# Patient Record
Sex: Male | Born: 1950 | Race: White | Hispanic: Yes | Marital: Married | Smoking: Never smoker
Health system: Southern US, Community
[De-identification: ages and names within clinical notes are randomized; demographics above are authoritative.]

## PROBLEM LIST (undated history)

## (undated) DIAGNOSIS — R112 Nausea with vomiting, unspecified: Secondary | ICD-10-CM

## (undated) DIAGNOSIS — I499 Cardiac arrhythmia, unspecified: Secondary | ICD-10-CM

## (undated) DIAGNOSIS — Z9889 Other specified postprocedural states: Secondary | ICD-10-CM

## (undated) DIAGNOSIS — M1990 Unspecified osteoarthritis, unspecified site: Secondary | ICD-10-CM

## (undated) DIAGNOSIS — K219 Gastro-esophageal reflux disease without esophagitis: Secondary | ICD-10-CM

## (undated) DIAGNOSIS — I10 Essential (primary) hypertension: Secondary | ICD-10-CM

## (undated) DIAGNOSIS — G4730 Sleep apnea, unspecified: Secondary | ICD-10-CM

## (undated) DIAGNOSIS — E041 Nontoxic single thyroid nodule: Secondary | ICD-10-CM

## (undated) HISTORY — PX: OTHER SURGICAL HISTORY: SHX169

---

## 2005-02-27 ENCOUNTER — Ambulatory Visit: Payer: Self-pay | Admitting: Gastroenterology

## 2011-03-25 DIAGNOSIS — G4730 Sleep apnea, unspecified: Secondary | ICD-10-CM

## 2011-03-25 HISTORY — DX: Sleep apnea, unspecified: G47.30

## 2011-07-11 ENCOUNTER — Ambulatory Visit: Payer: Self-pay | Admitting: Unknown Physician Specialty

## 2011-08-04 ENCOUNTER — Ambulatory Visit: Payer: Self-pay | Admitting: Unknown Physician Specialty

## 2011-09-03 ENCOUNTER — Ambulatory Visit: Payer: Self-pay | Admitting: Cardiology

## 2011-10-02 ENCOUNTER — Ambulatory Visit: Payer: Self-pay | Admitting: Cardiology

## 2012-12-15 ENCOUNTER — Observation Stay: Payer: Self-pay | Admitting: Internal Medicine

## 2012-12-15 LAB — BASIC METABOLIC PANEL
BUN: 21 mg/dL — ABNORMAL HIGH (ref 7–18)
Chloride: 105 mmol/L (ref 98–107)
Co2: 27 mmol/L (ref 21–32)
Creatinine: 1.1 mg/dL (ref 0.60–1.30)
EGFR (African American): >60
EGFR (Non-African Amer.): >60
Osmolality: 280 (ref 275–301)
Potassium: 3.4 mmol/L — ABNORMAL LOW (ref 3.5–5.1)
Sodium: 138 mmol/L (ref 136–145)

## 2012-12-15 LAB — CBC
HCT: 45.1 % (ref 40.0–52.0)
MCH: 30.7 pg (ref 26.0–34.0)
MCHC: 34.6 g/dL (ref 32.0–36.0)
RBC: 5.09 10*6/uL (ref 4.40–5.90)
WBC: 8.4 10*3/uL (ref 3.8–10.6)

## 2012-12-15 LAB — TROPONIN I
Troponin-I: <0.02 ng/mL
Troponin-I: <0.02 ng/mL
Troponin-I: <0.02 ng/mL

## 2012-12-15 LAB — CK TOTAL AND CKMB (NOT AT ARMC)
CK, Total: 86 U/L (ref 35–232)
CK, Total: 82 U/L (ref 35–232)

## 2013-01-03 ENCOUNTER — Ambulatory Visit: Payer: Self-pay | Admitting: Family Medicine

## 2013-07-28 DIAGNOSIS — G4733 Obstructive sleep apnea (adult) (pediatric): Secondary | ICD-10-CM | POA: Insufficient documentation

## 2013-07-28 DIAGNOSIS — K219 Gastro-esophageal reflux disease without esophagitis: Secondary | ICD-10-CM | POA: Insufficient documentation

## 2013-09-09 DIAGNOSIS — E041 Nontoxic single thyroid nodule: Secondary | ICD-10-CM | POA: Insufficient documentation

## 2013-09-09 DIAGNOSIS — E049 Nontoxic goiter, unspecified: Secondary | ICD-10-CM | POA: Insufficient documentation

## 2014-07-14 NOTE — Consult Note (Signed)
PATIENT NAME:  Frank Stephens, Frank Stephens DATE OF BIRTH:  May 07, 1950  DATE OF CONSULTATION:  12/15/2012  CONSULTING PHYSICIAN:  Dwayne Stephens. Juliann Paresallwood, MD  PRIMARY CARE PHYSICIAN:  Dr. Maryjane HurterFeldpausch.   REFERRING PHYSICIAN:  Dr. Amado CoeGouru.  INDICATION: Chest pain, hypoxemia.   HISTORY OF PRESENT ILLNESS: The patient is a 64 year old white male with a history of obstructive sleep apnea and hypertension who presented to the Emergency Room with chest pain and tightness. The patient reported the night prior to admission he was lying in bed wearing his CPAP and suddenly had heaviness in his chest, with shortness of breath. He could not breathe.  He felt anxious.  He does not have a history of chronic anxiety. When he could not breath, he started feeling anxious and came to the Emergency Room. Troponins were found to be negative x 2, EKGs were nonspecific. CT of the chest was negative. Pain was better on 2 liters of O2, but he was found to be relatively hypoxic on room air, so he was advised to be admitted for further evaluation and care. Reportedly Dr. Lady GaryFath did a regular stress test on him about a year ago as part of preop evaluation.   REVIEW OF SYSTEMS:  He has had no blackout spells, syncope. No nausea or vomiting. No fever. No chills. No sweats. No weight loss. No weight gain. No hemoptysis or hematemesis. No bright red blood per rectum. He has had shortness of breath, dyspnea, congestion and chest pain.   PAST MEDICAL HISTORY:  Obstructive sleep apnea, hypertension, allergic rhinitis   PAST SURGICAL HISTORY: Shoulder surgery and bilateral knee surgery.   ALLERGIES: None.   SOCIAL HISTORY: Lives with his wife and kids.  No smoking. Denies significant alcohol consumption. Works as a Paramedicpostal worker.   FAMILY HISTORY: DJD, hip surgery, hypertension, breast cancer, dementia.     PHYSICAL EXAMINATION: VITAL SIGNS: Blood pressure 130/80, pulse 80, respiratory rate 16, afebrile.  HEENT:  Normocephalic, atraumatic. Pupils equal and reactive to light.  NECK: Supple. No significant JVD, bruits, or adenopathy.  LUNGS: Exam was clear to auscultation and percussion with bilateral rhonchi. No wheezing or rales.  HEART: Regular rate and rhythm.  ABDOMEN:  Positive bowel sounds.  No rebound, guarding, or tenderness.  EXTREMITIES: Within normal limits. No cyanosis, clubbing, or edema.  NEUROLOGIC: Intact.  SKIN: Normal.   LABS/STUDIES: CBC was normal. ABG 21% room air, bicarbonate  Troponin 0.02. Glucose 119, BUN 21, creatinine 1.12, sodium 138, potassium 3.4, chloride 105, CO2 27. EKG normal sinus rhythm, nonspecific ST-T changes.   ASSESSMENT: 1.  Chest pain.  2.  Hypoxemia.  3.  Obstructive sleep apnea. 4.  Hypertension. 5.  Smoking.   PLAN:   1.  Agree with admit. Rule out for myocardial infarction. Follow up cardiac enzymes. Follow-up EKG. Follow-up telemetry.  2.  For hypoxemia continue supplemental oxygen. We will use inhalers, bronchodilators,  consider steroid therapy and oxygen. Continue CPAP in the meantime. Consider whether pulmonary follow-up is necessary.  3.  Advised the patient to quit smoking as part of pulmonary secondary prevention.  4.  Hypertension. Continue blood pressure therapy for now. Hold hydrochlorothiazide for now. Beta blocker may be helpful or calcium blocker to help with chest pain symptoms and blood pressure control.  5.  For obstructive sleep apnea place on CPAP.  6.  At this point and I do not believe further cardiac work-up is necessary. He had a regular stress test as an outpatient. If he has  improvement, we will try to consider further work-up as an outpatient with Dr. Lady Gary at a later date."   ____________________________ Bobbie Stack. Juliann Pares, MD ddc:dp Stephens: 12/15/2012 14:13:00 ET T: 12/15/2012 15:13:23 ET JOB#: [id number]  cc: Dwayne Stephens. Juliann Pares, MD, <Dictator> Alwyn Pea MD ELECTRONICALLY SIGNED 01/13/2013 13:51

## 2014-07-14 NOTE — Discharge Summary (Signed)
PATIENT NAME:  Frank Stephens, Frank Stephens MR#:  [id number] DATE OF BIRTH:  27-Mar-1950  DATE OF ADMISSION:  12/15/2012 DATE OF DISCHARGE:  12/15/2012  DISCHARGE DIAGNOSES: 1. Chest pain secondary to anxiety.  2. Hypertension.  3. Anxiety.  4. Chronic obstructive pulmonary disease.   DISCHARGE MEDICATIONS:  1. Aspirin 81 mg daily.  2. Hydrochlorothiazide and valsartan 25/160 mg 1 tablet p.o. daily.  3. Osteo-Bi-Flex 250/200 mg 2 tablets daily.  4. Ibuprofen 200 mg 2 tablets every 6 to 8 hours as needed.  5. Flonase 50 mcg 1 spray in each nostril daily.  6. Combivent Respimat 1 puff 4 times daily.  7. Prednisone 20 mg 2 tablets daily for 3 days, 1 tablet daily for 3 days and then stop. 8. Nitroglycerin sublingual p.r.n. for chest pain.  9. Prilosec 20 mg p.o. daily.  10. Oxygen 2 liters via nasal cannula all the time.   DIET: Low sodium diet.   CONSULTATIONS: Cardiology consult, Dr. Juliann Paresallwood.   HOSPITAL COURSE: A 64 year old male patient admitted for chest pain. Troponins have been negative x3. The patient was admitted to telemetry. The patient had a history of stress test in April this year with Dr. Lady GaryFath which was normal. The patient has a CPAP at night and because of chest heaviness the patient was placed on observation. The patient also was hypoxic with O2 sats low on room air and the patient's documented O2 sats 87% on room air, the patient on 2 liters 97% saturation. The patient seen by Dr. Juliann Paresallwood and he recommended that as his stress test was normal in April, he recommended to discharge the patient home and follow up with Dr. Lady GaryFath. Regarding his hypoxia, he did have some bronchitis. The patient had a CT of the chest, which showed no PE, has coronary artery disease and right thyroid lobe nodule, which was 8 mm, and the patientTSH   test was normal. His white count and BMP were normal. Chest x-ray also did not show any infiltrates. The patient uses oxygen 2 liters via nasal cannula all the time,  so discharged home with oxygen, gave  prescriptions for Combivent and Z-Pak, and the patient needs to follow up with his primary doctor and also Dr. Lady GaryFath. The patient's primary doctor is Film/video editorDanica Glass.advised pt to follow up with pmd regarding thyroid nodule,likely need thyroid sono and endocrinology follow up as out pt,  DISCHARGE VITAL SIGNS: Temperature 98.4, O2 sats on room air 95, blood pressure 133/77.   CONDITION AT THE TIME OF DISCHARGE: Stable.   TIME SPENT ON DISCHARGE PREPARATION: More than 30 minutes.   ____________________________ Katha HammingSnehalatha Saleema Weppler, MD sk:sg Stephens: 12/16/2012 12:47:00 ET T: 12/16/2012 13:19:17 ET JOB#: [id number]  cc: Katha HammingSnehalatha Nickalous Stingley, MD, <Dictator> Katha HammingSNEHALATHA Jesiah Grismer MD ELECTRONICALLY SIGNED 01/04/2013 10:47

## 2014-07-14 NOTE — H&P (Signed)
PATIENT NAME:  Frank Stephens, Frank Stephens MR#:  [id number] DATE OF BIRTH:  January 21, 1951  DATE OF ADMISSION:  12/15/2012  PRIMARY CARE PHYSICIAN: Marina Goodell, MD  REFERRING PHYSICIAN: Rebecka Apley, MD  CHIEF COMPLAINT: Chest pain and hypoxia.   HISTORY OF PRESENT ILLNESS: The patient is a 64 year old Caucasian male with a past medical history of obstructive sleep apnea, hypertension, who is presenting to the ER with a chief complaint of chest pressure and tightness. The patient is reporting that last night at around 11:30 p.m. while he was in his bed with his CPAP, he suddenly started having heaviness in his chest associated with shortness of breath. He could not breathe and felt anxious. He denies any chronic history of anxiety, but when he could not breathe, he started feeling anxious and came into the ER. In the ER, his troponins were found to be negative x2. EKG: No acute ST-T wave changes. CT angiogram of the chest has revealed no pulmonary embolism, no aortic aneurysm either. The patient denies any significant history of smoking in the past. The patient was satting fine in mid 90s on 2 liters of oxygen, but when the ER physician tried to be wean him off the oxygen, his pulse oximetry is dropping down to mid 80s. The patient also reported that he was diagnosed with allergic rhinitis recently and started on Flonase. He is having postnasal drip. Denies any sick contacts. No cough. Denies any fever. No similar complaints in the past. The patient was seen by Dr. Lady Gary, cardiologist, in the past for preop clearance prior to the shoulder surgery and had a stress test done in April 2014 which was normal. Denies any other complaints. Denies nausea, vomiting, diarrhea.   PAST MEDICAL HISTORY:  1. Obstructive sleep apnea, uses CPAP at bedtime.  2. Hypertension.  3. Allergic rhinitis.   PAST SURGICAL HISTORY:  1. Shoulder surgery.  2. Bilateral knee surgeries.  ALLERGIES: No known allergies.    PSYCHOSOCIAL HISTORY: Lives at home with wife. Intermittent smoking, a few cigarettes, several years ago, and he quit completely with smoking 20 years ago. No history of alcohol or illicit drug usage. Works for [employer].  FAMILY HISTORY: Mom had history of total hip replacement, hypertension and breast cancer. Father has dementia and hypertension.   REVIEW OF SYSTEMS:  CONSTITUTIONAL: Denies fever, fatigue.  EYES: Denies blurry vision, glaucoma.  ENT: Denies epistaxis, discharge. RESPIRATION: Denies cough, COPD.  CARDIOVASCULAR: Complaining of chest pain and shortness of breath.  GASTROINTESTINAL: Denies nausea, vomiting, diarrhea.  GENITOURINARY: No dysuria, hematuria.  ENDOCRINE: Denies polyuria, nocturia or thyroid problems.  HEMATOLOGIC AND LYMPHATIC: Denies anemia, easy bruising or bleeding.  MUSCULOSKELETAL: No joint pain in the neck, back and shoulder.  NEUROLOGIC: Denies vertigo or ataxia.  PSYCHIATRIC: No ADD, OCD.   PHYSICAL EXAMINATION:  VITAL SIGNS: Temperature 97.7, pulse 82, respirations 18, blood pressure 131/83, pulse oximetry 94% on 2 liters.  GENERAL APPEARANCE: Not under acute distress. Moderately built and nourished.  HEENT: Normocephalic, atraumatic. Pupils are equally reacting to light and accommodation. No scleral icterus. No conjunctival injection. Extraocular movements are intact. No nasal discharge, but positive postnasal drip. Ears: No external lesions. No drainage. Mouth: No lesions. Moist mucous membranes. Positive postnasal drip on the left side.  NECK: Supple. No JVD. No thyromegaly. Range of motion is intact.  LUNGS: Clear to auscultation. No wheezing. Good respiratory effort. No crackles.  CARDIOVASCULAR: Regular rate and rhythm with sinus arrhythmia. No murmurs, gallops or clicks. Pulses  are equal bilaterally in the femoral area. No peripheral edema.  GASTROINTESTINAL: Soft. Bowel sounds are positive in all 4 quadrants. Nontender,  nondistended. No hepatosplenomegaly. No masses felt.  NEUROLOGIC: Awake, alert and oriented x3. Cranial nerves II through XII are grossly intact. Motor and sensory grossly intact. Reflexes are 2+. SKIN: Warm to touch. Normal turgor. No rashes. No lesions. Well hydrated.  MUSCULOSKELETAL: No joint effusion, tenderness or erythema. Range of motion is grossly intact.  PSYCHIATRIC: Adequate judgment and insight. Normal mood and affect.   LABORATORY DATA AND IMAGING STUDIES: CBC normal. ABG: pH 7.42, pCO2 39, pO2 is 63 on FiO2 21%, base excess 0.8, bicarbonate is 25.3. Troponin less than 0.02 x2. Glucose 119, BUN 21, creatinine 1.10, sodium 138, potassium 3.4, chloride 105, CO2 27, GFR greater than 60, serum osmolality 280, calcium 8.5. A 12-lead EKG: Sinus arrhythmia, sinus rhythm at 79 beats per minute, normal PR and QRS intervals. No acute ST-T wave changes. CT angiogram of the chest: No pulmonary embolism. No acute findings in the chest.   ASSESSMENT AND PLAN: A 64 year old Caucasian male presenting to the ER with a chief complaint of chest tightness and hypoxia. Will be admitted with the following assessment and plan.   1. Chest pain, rule out acute myocardial infarction. Will admit him to telemetry. Cycle cardiac biomarkers. Will obtain echocardiogram. ACS protocol with oxygen, nitroglycerin, aspirin, beta blocker and statin. Cardiology consult is placed to Dr. Lady GaryFath. CT of chest is negative for pulmonary embolism.  2. Hypoxia, is probably from postnasal drip, coughing, bronchial constriction. Will provide him nebulizer treatments, steroids and oxygen via nasal cannula.  3. Obstructive sleep apnea. Continue CPAP at bedtime.  4. Hypertension. Resume his home medications. Will hold off on the hydrochlorothiazide as I am adding beta blocker to his regimen in view of chest pain.  5. Provide gastrointestinal and deep vein thrombosis prophylaxis.   Diagnosis and plan of care were discussed in detail with  the patient. He is aware of the plan.   CODE STATUS: He is full code. Wife is medical power of attorney.  TOTAL TIME SPENT ON ADMISSION: 45 minutes.   ____________________________ Ramonita LabAruna Shadrach Bartunek, MD ag:OSi D: 12/15/2012 07:50:31 ET T: 12/15/2012 08:06:41 ET JOB#: [id number]  cc: Ramonita LabAruna Kapil Petropoulos, MD, <Dictator> Darlin PriestlyKenneth A. Lady GaryFath, MD Ramonita LabARUNA Lynett Brasil MD ELECTRONICALLY SIGNED 12/24/2012 7:10

## 2014-07-16 NOTE — Op Note (Signed)
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   PROCEDURE PERFORMED: Arthroscopic chondral debridement of left shoulder along with debridement of torn labrum and subacromial decompression.   SURGEON: Alda BertholdHarold B. Tyeshia Cornforth, Jr., MD  ANESTHESIA: General.   HISTORY: The patient had a long history of left shoulder pain. He had been refractory to injection of his left subacromial space and his glenohumeral space with steroid and anesthetic. His plain films revealed very minimal degenerative change. MRI was consistent with some glenohumeral arthritis along with a torn posterior horn of his labrum and tendinosis. The patient was ultimately brought in for surgery due to his persistent symptoms despite conservative treatment.   DESCRIPTION OF PROCEDURE: The patient was taken to the operating room where satisfactory general anesthesia was achieved. The patient was turned to the lateral decubitus position with the left shoulder up. The left shoulder was prepped and draped in the usual fashion for an arthroscopic procedure. We used the Acufex shoulder suspension device to maintain the shoulder in about 25 degrees of abduction and about 10 degrees of forward flexion.   The scope was introduced through a posterior portal into the glenohumeral joint. The joint was extended with lactated Ringer's. We used the Mitek fluid pump to facilitate joint distention.   Inspection of the glenohumeral joint revealed the patient had some grade III changes in his humeral head chondral surface and in the articular surface of the glenoid. The labrum itself was frayed both anteriorly  and posteriorly. The biceps tendon appeared to be intact. Several loose bodies were noted. No obvious rotator cuff tear was appreciated.   I went ahead and established an anterior portal from inside out using a Wissinger rod. A Turbo Whisker was inserted to debride the chondral lesions and then a synovial resector was introduced to debride the frayed anterior labrum and to debride some of the synovial thickening anteriorly. Several loose bodies were removed at this time through the anterior cannula.   I then switched the scope to the anterior portal and brought a synovial resector into the posterior portal and debrided the posterior labral tear. I did insert a [employer]agon ArthroCare wand through the posterior portal and used it to coblate the humeral head and glenoid chondral lesions. I also introduced the wand through the anterior portal to finalize the coblation portion of the procedure.   I introduced the scope into the subacromial space. There was some fraying of the undersurface of the acromion and fraying of the rotator cuff. No significant cuff tear was appreciated, however. I established a lateral portal and used this portal to debride the subacromial space with a synovial resector and then an angled ArthroCare wand was used to remove the soft tissue from the undersurface of the acromion. The scope was then switched to a lateral portal and I brought a large acromionizer bur in through the posterior portal and used it to perform a subacromial decompression. The acromial attachment of the coracoacromial ligament was released at this time.   I then reinspected the cuff. Other than some fraying no obvious tear was appreciated.    The cannulas were removed and then I closed the puncture wounds with 3-0 nylon in vertical mattress fashion. Several milliliters of  0.5%  Marcaine without epinephrine was injected about each puncture wound and about 5 mL was injected into the subacromial space.   Betadine was  applied to the wounds followed by a sterile dressing. Four TENS pads were placed about the shoulder and then a sling was applied. The patient was turned supine and awakened. He was transferred to a stretcher bed and taken to the recovery room in satisfactory condition. Blood loss was negligible. ____________________________ Alda Berthold., MD hbk:slb Stephens: 08/04/2011 10:54:21 ET     T: 08/04/2011 16:24:43 ET        JOB#: [id number] Alda Berthold MD ELECTRONICALLY SIGNED 08/13/2011 18:57

## 2015-03-26 IMAGING — CR DG CHEST 1V PORT
1 series · 2 of 2 positions shown · non-contrast
Comparison: none

REASON FOR EXAM: shortness of breath
COMMENTS:   LMP: (Male)

PROCEDURE:     DXR - DXR PORTABLE CHEST SINGLE VIEW  - December 15, 2012  [DATE]
RESULT:     The lungs are clear. The heart and pulmonary vessels are normal.
The bony and mediastinal structures are unremarkable. There is no effusion.
There is no pneumothorax or evidence of congestive failure.

[Series 1: ap · 0.17mm/px · 2 of 2 slices shown]
[im 1/2]
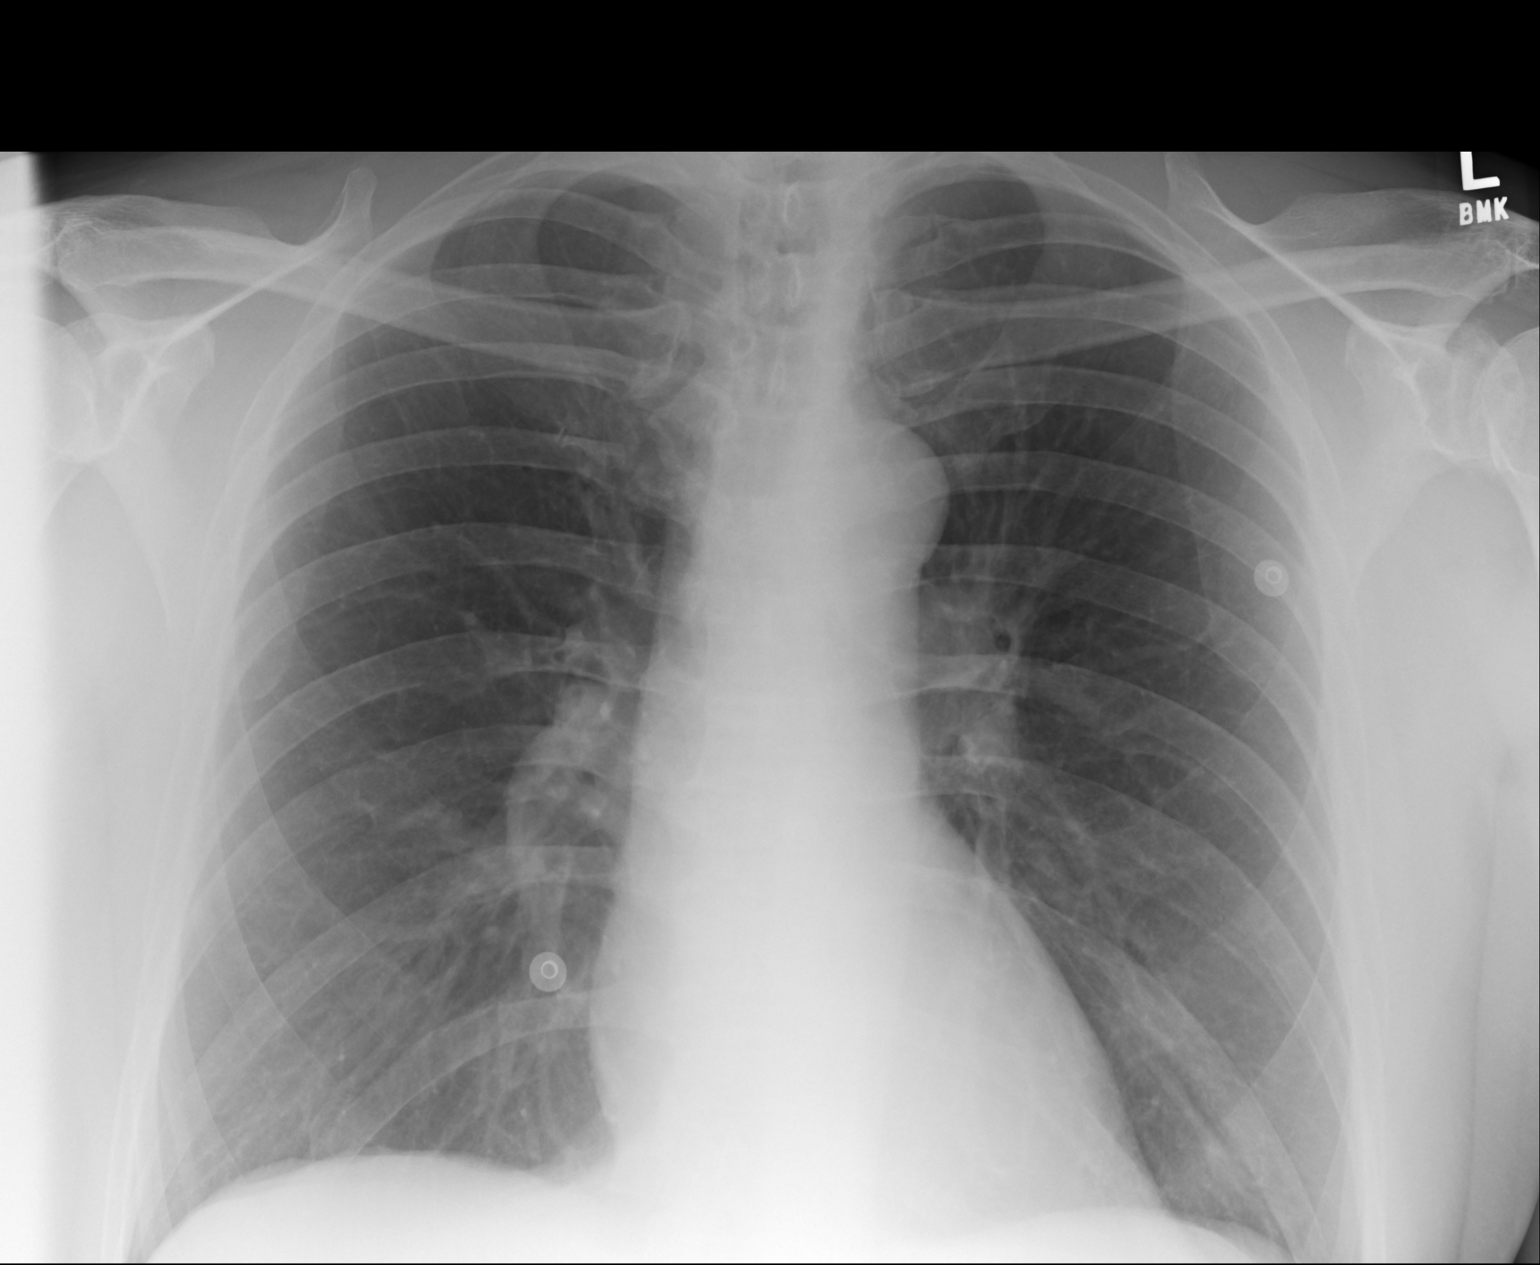
[im 2/2]
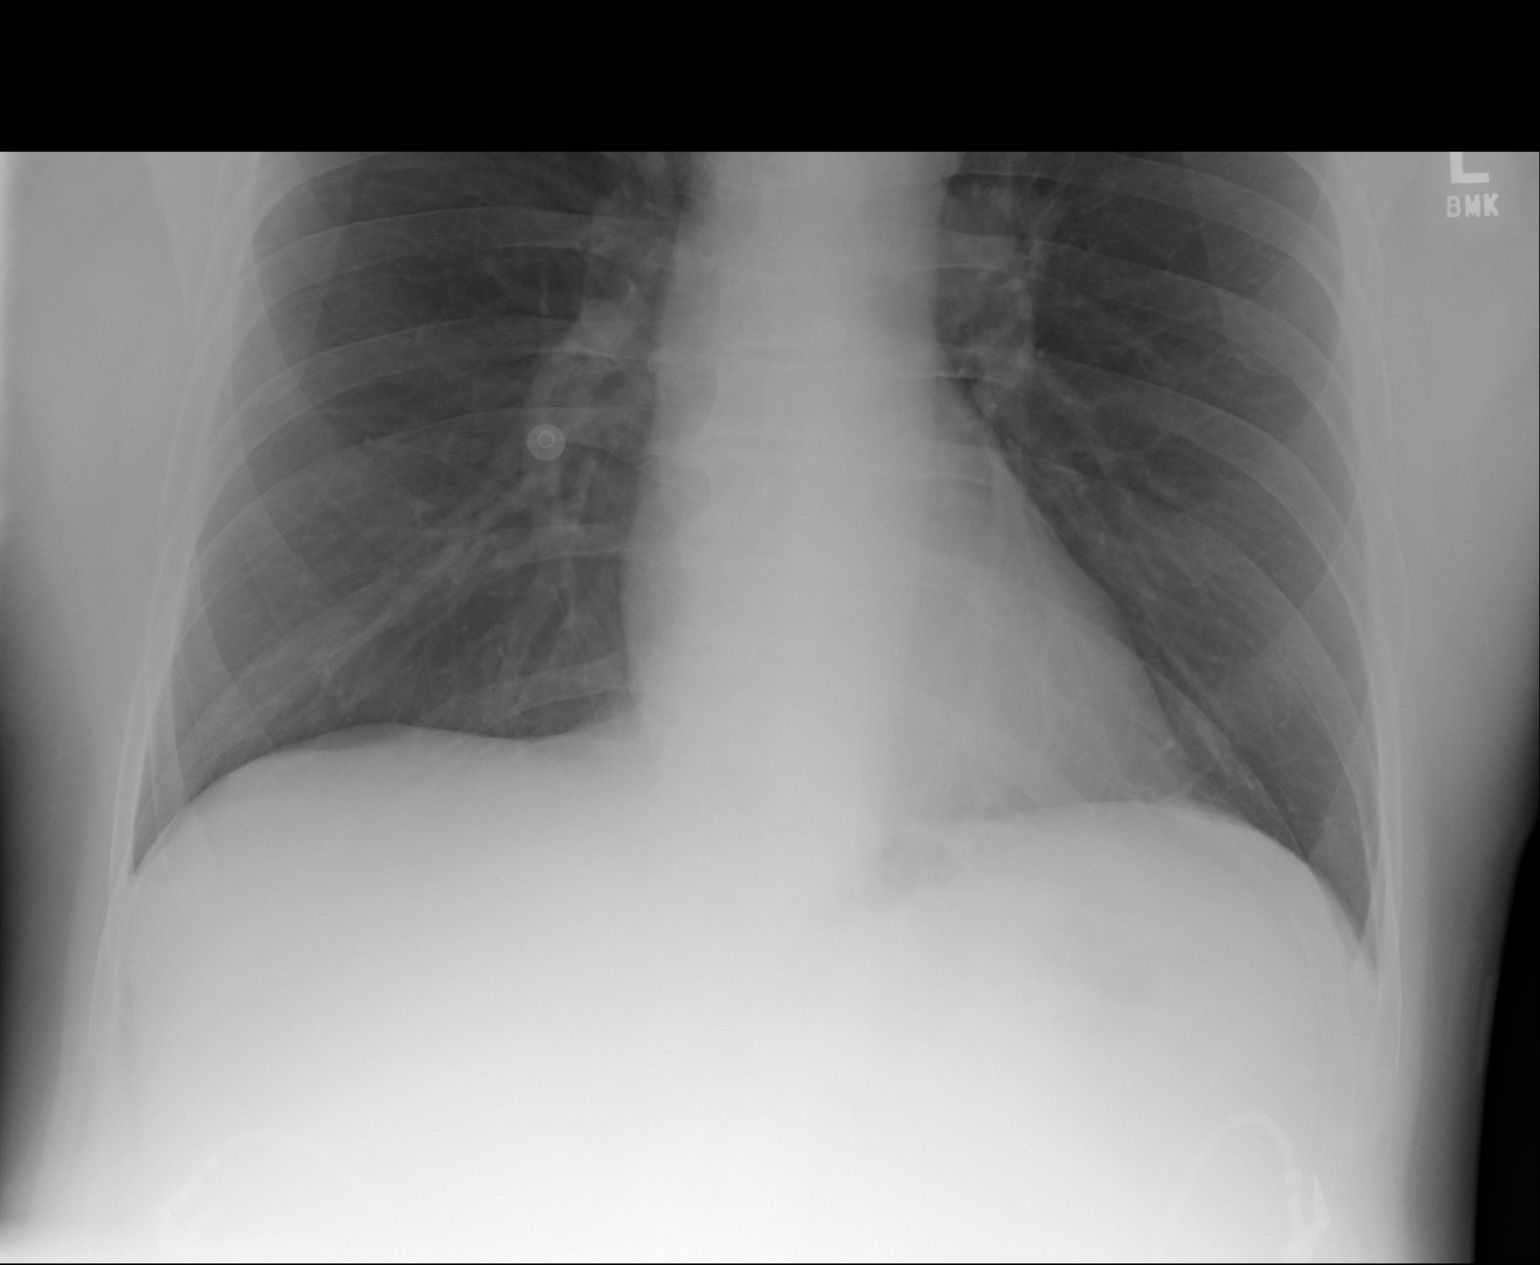

[2 of 2 positions shown; findings below may reference images not displayed]

IMPRESSION: No acute cardiopulmonary disease.

[REDACTED]

## 2015-05-31 ENCOUNTER — Ambulatory Visit
Admission: EM | Admit: 2015-05-31 | Discharge: 2015-05-31 | Disposition: A | Discharge status: Home / Self Care | Payer: 59 | Attending: Family Medicine | Admitting: Family Medicine

## 2015-05-31 DIAGNOSIS — L309 Dermatitis, unspecified: Secondary | ICD-10-CM

## 2015-05-31 DIAGNOSIS — L508 Other urticaria: Secondary | ICD-10-CM

## 2015-05-31 HISTORY — DX: Sleep apnea, unspecified: G47.30

## 2015-05-31 HISTORY — DX: Gastro-esophageal reflux disease without esophagitis: K21.9

## 2015-05-31 HISTORY — DX: Essential (primary) hypertension: I10

## 2015-05-31 MED ORDER — PREDNISONE 10 MG (21) PO TBPK: ORAL_TABLET | ORAL | Status: DC

## 2015-05-31 MED ORDER — RANITIDINE HCL 150 MG PO CAPS: 150.0000 mg | ORAL_CAPSULE | Freq: Two times a day (BID) | ORAL | Status: DC

## 2015-05-31 MED ORDER — LORATADINE 10 MG PO TABS: 10.0000 mg | ORAL_TABLET | Freq: Every day | ORAL | Status: DC

## 2015-05-31 NOTE — ED Notes (Signed)
Patient c/o itchy rash on stomach, lower back, both arms, and both legs which he noticed several months ago.

## 2015-05-31 NOTE — Discharge Instructions (Signed)
Rash A rash is a change in the color or feel of your skin. There are many different types of rashes. You may have other problems along with your rash. HOME CARE  Avoid the thing that caused your rash.  Do not scratch your rash.  You may take cools baths to help stop itching.  Only take medicines as told by your doctor.  Keep all doctor visits as told. GET HELP RIGHT AWAY IF:   Your pain, puffiness (swelling), or redness gets worse.  You have a fever.  You have new or severe problems.  You have body aches, watery poop (diarrhea), or you throw up (vomit).  Your rash is not better after 3 days. MAKE SURE YOU:   Understand these instructions.  Will watch your condition.  Will get help right away if you are not doing well or get worse.   This information is not intended to replace advice given to you by your health care provider. Make sure you discuss any questions you have with your health care provider.   Document Released: 08/27/2007 Document Revised: 06/02/2011 Document Reviewed: 07/26/2014 Elsevier Interactive Patient Education 2016 [employer]  Stasis Dermatitis Stasis dermatitis occurs when veins lose the ability to pump blood back to the heart (poor venous circulation). It causes a reddish-purple to brownish scaly, itchy rash on the legs. The rash comes from pooling of blood (stasis). CAUSES  This occurs because the veins do not work very well anymore or because pressure may be increased in the veins due to other conditions. With blood pooling, the increased pressure in the tiny blood vessels (capillaries) causes fluid to leak out of the capillaries into the tissue. The extra fluid makes it harder for the blood to feed the cells and get rid of waste products. SYMPTOMS  Stasis dermatitis appears as red, scaly, itchy patches on the legs. A yellowish or light brown discoloration is also present. Due to scratching or other injury, these patches can become an ulcer. This  ulcer may remain for long periods of time. The ulcer can also become infected. Swelling of the legs is often present with stasis dermatitis. If the leg is swollen, this increases the risk of infection and further damage to the skin. Sometimes, intense itching, tingling, and burning occurs before signs of stasis dermatitis appear. You may find yourself scratching the insides of your ankles or rubbing your ankles together before the rash appears. After healing, there are often brown spots on the affected skin. DIAGNOSIS  Your caregiver makes this diagnosis based on an exam. Other tests may be done to better understand the cause. TREATMENT  If underlying conditions are present, they must be treated. Some of these conditions are heart failure, thyroid problems, poor nutrition, and varicose veins.  Cortisone creams and ointments applied to the skin (topically) may be needed, as well as medicine to reduce swelling in the legs (diuretics).  Compression stockings or an elastic wrap may also be needed to reduce swelling.  If there is an infection, antibiotic medicines may also be used. HOME CARE INSTRUCTIONS   Try to rest and raise (elevate) the affected leg above the level of the heart, if possible.  Burow's solution wet packs applied for 30 minutes, 3 times daily, will help the weepy rash. Stop using the packs before your skin gets too dry. You can also use a mixture of 3 parts white vinegar to 1 quart water.  Grease your legs daily with ointments, such as petroleum jelly, to fight  dryness.  Avoid scratching or injuring the area. SEEK IMMEDIATE MEDICAL CARE IF:   Your rash gets worse.  An ulcer forms.  You have an oral temperature above 102 F (38.9 C), not controlled by medicine.  You have any other severe symptoms.   This information is not intended to replace advice given to you by your health care provider. Make sure you discuss any questions you have with your health care provider.     Document Released: 06/19/2005 Document Revised: 06/02/2011 Document Reviewed: 07/26/2014 Elsevier Interactive Patient Education [employer]

## 2015-05-31 NOTE — ED Provider Notes (Signed)
CSN: [id number]     Arrival date & time 05/31/15  1549 History   First MD Initiated Contact with Patient 05/31/15 1726    Nurses notes were reviewed.  Chief Complaint  Patient presents with  . Rash  Interesting presentation patient reports that he's had this rash does been over his chest and lower abdomen and lower back since November. His PCP saw that there was more of a heat rash. He is wearing shirt or T-shirt that will wick moisture away and the rash keeps getting worse. I has rash then spread from his lower abdomen and lower back to his neck and shoulder and is also has some blanching of the rash well. He's been using an itch cream which hasn't helped and the rash has gotten worse. He has a history of allergies to ACES.   He does not smoke he has a history of hypertension sleep apnea and acid reflux. He's had multiple orthopedic surgery and he takes bile flexes knees and a multivitamin as well. Mother has hypertension father had dementia and Alzheimer's disease as well as hypertension.  He works for the [employer] as well. (Consider location/radiation/quality/duration/timing/severity/associated sxs/prior Treatment) HPI  Past Medical History  Diagnosis Date  . Hypertension   . Sleep apnea   . Acid reflux    Past Surgical History  Procedure Laterality Date  . 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 Left shoulder surgery    . Right knee arthroscopy     Family History  Problem Relation Age of Onset  . Hypertension Mother   . Dementia Father   . Alzheimer's disease Father   . Hypertension Father    Social History  Substance Use Topics  . Smoking status: Never Smoker   . Smokeless tobacco: Never Used  . Alcohol Use: No    Review of Systems  Constitutional: Negative.   HENT: Negative.   Skin: Positive for rash.    Allergies  Ace inhibitors  Home Medications   Prior to Admission medications   Medication Sig Start Date End Date Taking?  Authorizing Provider  aspirin 81 MG tablet Take 81 mg by mouth daily.   Yes Historical Provider, MD  Misc Natural Products (OSTEO BI-FLEX ADV JOINT SHIELD) TABS Take by mouth daily.   Yes Historical Provider, MD  Multiple Vitamin (MULTIVITAMIN) tablet Take 1 tablet by mouth daily.   Yes Historical Provider, MD  pantoprazole (PROTONIX) 40 MG tablet Take 40 mg by mouth daily.   Yes Historical Provider, MD  valsartan-hydrochlorothiazide (DIOVAN-HCT) 160-25 MG tablet Take 1 tablet by mouth daily.   Yes Historical Provider, MD  fluticasone (FLONASE) 50 MCG/ACT nasal spray Place 2 sprays into both nostrils as needed for allergies or rhinitis.    Historical Provider, MD  loratadine (CLARITIN) 10 MG tablet Take 1 tablet (10 mg total) by mouth daily. Take 1 tablet in the morning. As needed for itching. 05/31/15   Hassan Rowan, MD  predniSONE (STERAPRED UNI-PAK 21 TAB) 10 MG (21) TBPK tablet 6 tabs day 1 and 2, 5 tabs day 3 and 4, 4 tabs day 5 and 6, 3 tabs day 7 and 8, 2 tabs day 9 and 10, 1 tab day 11 and 12. Take orally 05/31/15   Hassan Rowan, MD  ranitidine (ZANTAC) 150 MG capsule Take 1 capsule (150 mg total) by mouth 2 (two) times daily. 05/31/15   Hassan Rowan, MD   Meds Ordered and Administered this Visit  Medications -  No data to display  BP 125/79 mmHg  Pulse 90  Temp(Src) 98.3 F (36.8 C) (Oral)  Resp 18  Ht 5\' 10"  (1.778 m)  Wt 208 lb (94.348 kg)  BMI 29.84 kg/m2  SpO2 97% No data found.   Physical Exam  Constitutional: He is oriented to person, place, and time. He appears well-developed and well-nourished.  HENT:  Head: Normocephalic.  Eyes: Pupils are equal, round, and reactive to light.  Neck: Normal range of motion. Neck supple.  Musculoskeletal: Normal range of motion. He exhibits no edema or tenderness.  Neurological: He is alert and oriented to person, place, and time.  Skin: Rash noted. There is erythema.     Patient has multiple excoriation where he scratched this rash the  rash is most of the lower abdomen and lower back arms and legs on the rash is raised of very none specific and then he describes what sounds urticarial rash as well that may be occurring because of the recurrent and persistent dermatitis that he's had now for several months.  Psychiatric: He has a normal mood and affect. His behavior is normal.  Vitals reviewed.   ED Course  Procedures (including critical care time)  Labs Review Labs Reviewed - No data to display  Imaging Review No results found.   Visual Acuity Review  Right Eye Distance:   Left Eye Distance:   Bilateral Distance:    Right Eye Near:   Left Eye Near:    Bilateral Near:         MDM   1. Dermatitis   2. Urticaria, acute    We'll treat patient for his dermatitis and almost urticarial like recurrence with Zantac and Claritin. And for the foot appears to be a chronic dermatitis with 12 day course of prednisone. Strong suggested this does not take care of the problem that he see a contact his PCP Monday getting into see the dermatologist ASAP.    Hassan RowanEugene Anslie Spadafora, MD 05/31/15 [phone]

## 2016-12-31 ENCOUNTER — Other Ambulatory Visit
Admission: RE | Admit: 2016-12-31 | Discharge: 2016-12-31 | Disposition: A | Discharge status: Home / Self Care | Payer: 59 | Source: Ambulatory Visit | Attending: Gastroenterology | Admitting: Gastroenterology

## 2016-12-31 DIAGNOSIS — R197 Diarrhea, unspecified: Secondary | ICD-10-CM | POA: Diagnosis not present

## 2016-12-31 LAB — CLOSTRIDIUM DIFFICILE BY PCR: Toxigenic C. Difficile by PCR: POSITIVE — AB

## 2016-12-31 LAB — C DIFFICILE QUICK SCREEN W PCR REFLEX
C Diff antigen: POSITIVE — AB
C Diff toxin: NEGATIVE

## 2018-02-21 DIAGNOSIS — M1712 Unilateral primary osteoarthritis, left knee: Secondary | ICD-10-CM | POA: Insufficient documentation

## 2018-02-21 DIAGNOSIS — M170 Bilateral primary osteoarthritis of knee: Secondary | ICD-10-CM | POA: Insufficient documentation

## 2018-05-05 ENCOUNTER — Encounter
Admission: RE | Admit: 2018-05-05 | Discharge: 2018-05-05 | Disposition: A | Discharge status: Home / Self Care | Payer: 59 | Source: Ambulatory Visit | Attending: Orthopedic Surgery | Admitting: Orthopedic Surgery

## 2018-05-05 ENCOUNTER — Other Ambulatory Visit: Payer: Self-pay

## 2018-05-05 DIAGNOSIS — I10 Essential (primary) hypertension: Secondary | ICD-10-CM | POA: Insufficient documentation

## 2018-05-05 DIAGNOSIS — Z01818 Encounter for other preprocedural examination: Secondary | ICD-10-CM | POA: Diagnosis not present

## 2018-05-05 HISTORY — DX: Other specified postprocedural states: Z98.890

## 2018-05-05 HISTORY — DX: Nausea with vomiting, unspecified: R11.2

## 2018-05-05 LAB — URINALYSIS, ROUTINE W REFLEX MICROSCOPIC
Bilirubin Urine: NEGATIVE
Glucose, UA: NEGATIVE mg/dL
Hgb urine dipstick: NEGATIVE
Ketones, ur: NEGATIVE mg/dL
LEUKOCYTE UA: NEGATIVE
Nitrite: NEGATIVE
PROTEIN: NEGATIVE mg/dL
Specific Gravity, Urine: 1.011 (ref 1.005–1.030)
pH: 7 (ref 5.0–8.0)

## 2018-05-05 LAB — TYPE AND SCREEN
ABO/RH(D): A POS
Antibody Screen: NEGATIVE

## 2018-05-05 LAB — COMPREHENSIVE METABOLIC PANEL
ALT: 13 U/L (ref 0–44)
AST: 17 U/L (ref 15–41)
Albumin: 4.4 g/dL (ref 3.5–5.0)
Alkaline Phosphatase: 45 U/L (ref 38–126)
Anion gap: 7 (ref 5–15)
BILIRUBIN TOTAL: 0.9 mg/dL (ref 0.3–1.2)
BUN: 21 mg/dL (ref 8–23)
CHLORIDE: 102 mmol/L (ref 98–111)
CO2: 29 mmol/L (ref 22–32)
CREATININE: 0.92 mg/dL (ref 0.61–1.24)
Calcium: 9.4 mg/dL (ref 8.9–10.3)
GFR calc Af Amer: >60 mL/min (ref >60)
Glucose, Bld: 108 mg/dL — ABNORMAL HIGH (ref 70–99)
Potassium: 3.5 mmol/L (ref 3.5–5.1)
Sodium: 138 mmol/L (ref 135–145)
Total Protein: 7.3 g/dL (ref 6.5–8.1)

## 2018-05-05 LAB — CBC
HEMATOCRIT: 46.5 % (ref 39.0–52.0)
Hemoglobin: 15.6 g/dL (ref 13.0–17.0)
MCH: 29.9 pg (ref 26.0–34.0)
MCHC: 33.5 g/dL (ref 30.0–36.0)
MCV: 89.3 fL (ref 80.0–100.0)
PLATELETS: 231 10*3/uL (ref 150–400)
RBC: 5.21 MIL/uL (ref 4.22–5.81)
RDW: 12.4 % (ref 11.5–15.5)
WBC: 7.6 10*3/uL (ref 4.0–10.5)
nRBC: 0 % (ref 0.0–0.2)

## 2018-05-05 LAB — PROTIME-INR
INR: 0.93
Prothrombin Time: 12.4 seconds (ref 11.4–15.2)

## 2018-05-05 LAB — SEDIMENTATION RATE: Sed Rate: 2 mm/hr (ref 0–20)

## 2018-05-05 LAB — APTT: aPTT: 32 seconds (ref 24–36)

## 2018-05-05 LAB — SURGICAL PCR SCREEN
MRSA, PCR: NEGATIVE
Staphylococcus aureus: NEGATIVE

## 2018-05-05 LAB — C-REACTIVE PROTEIN: CRP: <0.8 mg/dL (ref <1.0)

## 2018-05-05 NOTE — Patient Instructions (Signed)
  Your procedure is scheduled on: Monday May 17, 2018 Report to Same Day Surgery 2nd floor [facility] Entrance-take elevator on left to 2nd floor.  Check in with surgery information desk.) To find out your arrival time, call [phone] 1:00-3:00 PM on Friday May 14, 2018  Remember: Instructions that are not followed completely may result in serious medical risk, up to and including death, or upon the discretion of your surgeon and anesthesiologist your surgery may need to be rescheduled.    __x__ 1. Do not eat food (including mints, candies, chewing gum) after midnight the night before your procedure. You may drink clear liquids up to 2 hours before you are scheduled to arrive at the hospital for your procedure.  Do not drink anything within 2 hours of your scheduled arrival to the hospital.  Approved clear liquids:  --Water or Apple juice without pulp  --Clear carbohydrate beverage such as Gatorade or Powerade  --Black Coffee or Clear Tea (No milk, no creamers, do not add anything to the coffee or tea)    __x__ 2. No Alcohol for 24 hours before or after surgery.   __x__ 3. No Smoking or e-cigarettes for 24 hours before surgery.  Do not use any chewable tobacco products for at least 6 hours before surgery.   __x__ 4. Notify your doctor if there is any change in your medical condition (cold, fever, infections).   __x__ 5. On the morning of surgery brush your teeth with toothpaste and water.  You may rinse your mouth with mouthwash if you wish.  Do not swallow any toothpaste or mouthwash.  Please read over the following fact sheets that you were given:   [facility] Preparing for Surgery and/or MRSA Information    __x__ Use CHG Soap or Sage wipes as directed on instruction sheet   Do not wear jewelry on the day of surgery.  Do not wear lotions, powders, deodorant, or perfumes.   Do not shave below the face/neck 48 hours prior to surgery.   Do not bring  valuables to the hospital.    [facility] is not responsible for any belongings or valuables.               Contacts, dentures or bridgework may not be worn into surgery.  Leave your suitcase in the car. After surgery it may be brought to your room.  For patients admitted to the hospital, discharge time is determined by your treatment team.  __x__ Take these medicines the morning of surgery with a SMALL SIP OF WATER:  1. Pantoprazole/Protonix  Skip your Losartan and Hydrochlorothiazide on the morning of surgery.  __x__ Bring C-Pap/Bi-Pap machine to the hospital.   __x__ Follow recommendations from Cardiologist, Pulmonologist or PCP regarding stopping Aspirin, Coumadin, Plavix, Eliquis, Effient, Pradaxa, and Pletal.  __x__ At least 7 days prior to surgery: Stop Anti-inflammatories such as Aspirin, Advil, Ibuprofen, Motrin, Aleve, Naproxen, Naprosyn, BC/Goodies powders or aspirin products. You may continue to take Tylenol and Celebrex.   __x__ At least 7 days prior to surgery: Stop (Osteo Bi Flex) supplements until after surgery. You may continue to take Vitamin D, Vitamin B, and multivitamin.

## 2018-05-06 LAB — URINE CULTURE
Culture: NO GROWTH
Special Requests: NORMAL

## 2018-05-16 MED ORDER — TRANEXAMIC ACID-NACL 1000-0.7 MG/100ML-% IV SOLN
1000.0000 mg | INTRAVENOUS | Status: AC
  Administered 2018-05-17: 1000 mg via INTRAVENOUS
  Filled 2018-05-16: qty 100

## 2018-05-16 MED ORDER — CEFAZOLIN SODIUM-DEXTROSE 2-4 GM/100ML-% IV SOLN
2.0000 g | INTRAVENOUS | Status: AC
  Administered 2018-05-17: 2 g via INTRAVENOUS

## 2018-05-17 ENCOUNTER — Ambulatory Visit: Payer: 59 | Admitting: Certified Registered Nurse Anesthetist

## 2018-05-17 ENCOUNTER — Encounter
Admission: RE | Disposition: A | Discharge status: Home Health | Payer: Self-pay | Source: Home / Self Care | Attending: Orthopedic Surgery

## 2018-05-17 ENCOUNTER — Other Ambulatory Visit: Payer: Self-pay

## 2018-05-17 ENCOUNTER — Inpatient Hospital Stay
Admission: RE | Admit: 2018-05-17 | Discharge: 2018-05-19 | DRG: 470 | Disposition: A | Discharge status: Home Health | Payer: 59 | Attending: Orthopedic Surgery | Admitting: Orthopedic Surgery

## 2018-05-17 ENCOUNTER — Encounter: Payer: Self-pay | Admitting: Orthopedic Surgery

## 2018-05-17 ENCOUNTER — Inpatient Hospital Stay: Payer: 59

## 2018-05-17 DIAGNOSIS — G4730 Sleep apnea, unspecified: Secondary | ICD-10-CM | POA: Diagnosis present

## 2018-05-17 DIAGNOSIS — M1711 Unilateral primary osteoarthritis, right knee: Secondary | ICD-10-CM | POA: Diagnosis present

## 2018-05-17 DIAGNOSIS — Z96659 Presence of unspecified artificial knee joint: Secondary | ICD-10-CM

## 2018-05-17 DIAGNOSIS — Z96651 Presence of right artificial knee joint: Secondary | ICD-10-CM

## 2018-05-17 DIAGNOSIS — K219 Gastro-esophageal reflux disease without esophagitis: Secondary | ICD-10-CM | POA: Diagnosis present

## 2018-05-17 DIAGNOSIS — I10 Essential (primary) hypertension: Secondary | ICD-10-CM | POA: Diagnosis present

## 2018-05-17 DIAGNOSIS — E7800 Pure hypercholesterolemia, unspecified: Secondary | ICD-10-CM | POA: Diagnosis present

## 2018-05-17 HISTORY — PX: TOTAL KNEE ARTHROPLASTY: SHX125

## 2018-05-17 LAB — ABO/RH: ABO/RH(D): A POS

## 2018-05-17 SURGERY — ARTHROPLASTY, KNEE, TOTAL
Anesthesia: Spinal | Site: Knee | Laterality: Right

## 2018-05-17 MED ORDER — LOSARTAN POTASSIUM 50 MG PO TABS: 50.0000 mg | ORAL_TABLET | Freq: Every day | ORAL | Status: DC

## 2018-05-17 MED ORDER — FENTANYL CITRATE (PF) 100 MCG/2ML IJ SOLN
INTRAMUSCULAR | Status: DC | PRN
  Administered 2018-05-17: 50 ug via INTRAVENOUS

## 2018-05-17 MED ORDER — ACETAMINOPHEN 10 MG/ML IV SOLN
INTRAVENOUS | Status: AC
  Filled 2018-05-17: qty 100

## 2018-05-17 MED ORDER — PROPOFOL 10 MG/ML IV BOLUS
INTRAVENOUS | Status: DC | PRN
  Administered 2018-05-17 (×2): 18 mg via INTRAVENOUS

## 2018-05-17 MED ORDER — CEFAZOLIN SODIUM-DEXTROSE 2-4 GM/100ML-% IV SOLN
INTRAVENOUS | Status: AC
  Filled 2018-05-17: qty 100

## 2018-05-17 MED ORDER — ACETAMINOPHEN 325 MG PO TABS: 325.0000 mg | ORAL_TABLET | Freq: Four times a day (QID) | ORAL | Status: DC | PRN

## 2018-05-17 MED ORDER — METOCLOPRAMIDE HCL 5 MG/ML IJ SOLN: 5.0000 mg | Freq: Three times a day (TID) | INTRAMUSCULAR | Status: DC | PRN

## 2018-05-17 MED ORDER — PANTOPRAZOLE SODIUM 40 MG PO TBEC
40.0000 mg | DELAYED_RELEASE_TABLET | Freq: Two times a day (BID) | ORAL | Status: DC
  Administered 2018-05-17 – 2018-05-19 (×4): 40 mg via ORAL
  Filled 2018-05-17 (×4): qty 1

## 2018-05-17 MED ORDER — TRANEXAMIC ACID-NACL 1000-0.7 MG/100ML-% IV SOLN
1000.0000 mg | Freq: Once | INTRAVENOUS | Status: AC
  Administered 2018-05-17: 1000 mg via INTRAVENOUS
  Filled 2018-05-17: qty 100

## 2018-05-17 MED ORDER — FERROUS SULFATE 325 (65 FE) MG PO TABS
325.0000 mg | ORAL_TABLET | Freq: Two times a day (BID) | ORAL | Status: DC
  Administered 2018-05-17 – 2018-05-19 (×4): 325 mg via ORAL
  Filled 2018-05-17 (×4): qty 1

## 2018-05-17 MED ORDER — ENOXAPARIN SODIUM 30 MG/0.3ML ~~LOC~~ SOLN
30.0000 mg | Freq: Two times a day (BID) | SUBCUTANEOUS | Status: DC
  Administered 2018-05-18 – 2018-05-19 (×3): 30 mg via SUBCUTANEOUS
  Filled 2018-05-17 (×3): qty 0.3

## 2018-05-17 MED ORDER — DEXAMETHASONE SODIUM PHOSPHATE 10 MG/ML IJ SOLN
8.0000 mg | Freq: Once | INTRAMUSCULAR | Status: AC
  Administered 2018-05-17: 8 mg via INTRAVENOUS

## 2018-05-17 MED ORDER — SODIUM CHLORIDE 0.9 % IV SOLN
INTRAVENOUS | Status: DC | PRN
  Administered 2018-05-17: 08:00:00

## 2018-05-17 MED ORDER — ACETAMINOPHEN 10 MG/ML IV SOLN
1000.0000 mg | Freq: Four times a day (QID) | INTRAVENOUS | Status: AC
  Administered 2018-05-17 – 2018-05-18 (×4): 1000 mg via INTRAVENOUS
  Filled 2018-05-17 (×3): qty 100

## 2018-05-17 MED ORDER — GABAPENTIN 300 MG PO CAPS
300.0000 mg | ORAL_CAPSULE | Freq: Every day | ORAL | Status: DC
  Administered 2018-05-17 – 2018-05-18 (×2): 300 mg via ORAL
  Filled 2018-05-17 (×2): qty 1

## 2018-05-17 MED ORDER — ONDANSETRON HCL 4 MG/2ML IJ SOLN: 4.0000 mg | Freq: Four times a day (QID) | INTRAMUSCULAR | Status: DC | PRN

## 2018-05-17 MED ORDER — SENNOSIDES-DOCUSATE SODIUM 8.6-50 MG PO TABS
1.0000 | ORAL_TABLET | Freq: Two times a day (BID) | ORAL | Status: DC
  Administered 2018-05-17 – 2018-05-18 (×3): 1 via ORAL
  Filled 2018-05-17 (×3): qty 1

## 2018-05-17 MED ORDER — FLEET ENEMA 7-19 GM/118ML RE ENEM: 1.0000 | ENEMA | Freq: Once | RECTAL | Status: DC | PRN

## 2018-05-17 MED ORDER — BUPIVACAINE HCL (PF) 0.5 % IJ SOLN
INTRAMUSCULAR | Status: DC | PRN
  Administered 2018-05-17: 2.6 mL

## 2018-05-17 MED ORDER — PHENOL 1.4 % MT LIQD: 1.0000 | OROMUCOSAL | Status: DC | PRN

## 2018-05-17 MED ORDER — CHLORHEXIDINE GLUCONATE 4 % EX LIQD: 60.0000 mL | Freq: Once | CUTANEOUS | Status: DC

## 2018-05-17 MED ORDER — GABAPENTIN 300 MG PO CAPS
300.0000 mg | ORAL_CAPSULE | Freq: Once | ORAL | Status: AC
  Administered 2018-05-17: 300 mg via ORAL

## 2018-05-17 MED ORDER — FENTANYL CITRATE (PF) 100 MCG/2ML IJ SOLN: 25.0000 ug | INTRAMUSCULAR | Status: DC | PRN

## 2018-05-17 MED ORDER — SODIUM CHLORIDE FLUSH 0.9 % IV SOLN
INTRAVENOUS | Status: AC
  Filled 2018-05-17: qty 40

## 2018-05-17 MED ORDER — PROMETHAZINE HCL 25 MG/ML IJ SOLN: 6.2500 mg | INTRAMUSCULAR | Status: DC | PRN

## 2018-05-17 MED ORDER — SODIUM CHLORIDE 0.9 % IV SOLN
INTRAVENOUS | Status: DC
  Administered 2018-05-17 – 2018-05-18 (×4): via INTRAVENOUS

## 2018-05-17 MED ORDER — LIDOCAINE HCL (CARDIAC) PF 100 MG/5ML IV SOSY
PREFILLED_SYRINGE | INTRAVENOUS | Status: DC | PRN
  Administered 2018-05-17: 100 mg via INTRAVENOUS

## 2018-05-17 MED ORDER — LIDOCAINE HCL (PF) 2 % IJ SOLN
INTRAMUSCULAR | Status: AC
  Filled 2018-05-17: qty 10

## 2018-05-17 MED ORDER — TETRACAINE HCL 1 % IJ SOLN
INTRAMUSCULAR | Status: DC | PRN
  Administered 2018-05-17: 4 mg via INTRASPINAL

## 2018-05-17 MED ORDER — PHENYLEPHRINE HCL 10 MG/ML IJ SOLN
INTRAMUSCULAR | Status: AC
  Filled 2018-05-17: qty 2

## 2018-05-17 MED ORDER — BUPIVACAINE LIPOSOME 1.3 % IJ SUSP
INTRAMUSCULAR | Status: AC
  Filled 2018-05-17: qty 20

## 2018-05-17 MED ORDER — FENTANYL CITRATE (PF) 100 MCG/2ML IJ SOLN
INTRAMUSCULAR | Status: AC
  Filled 2018-05-17: qty 2

## 2018-05-17 MED ORDER — ADULT MULTIVITAMIN W/MINERALS CH
1.0000 | ORAL_TABLET | Freq: Every day | ORAL | Status: DC
  Administered 2018-05-18 – 2018-05-19 (×2): 1 via ORAL
  Filled 2018-05-17 (×2): qty 1

## 2018-05-17 MED ORDER — CELECOXIB 200 MG PO CAPS
ORAL_CAPSULE | ORAL | Status: AC
  Administered 2018-05-17: 400 mg via ORAL
  Filled 2018-05-17: qty 2

## 2018-05-17 MED ORDER — METOCLOPRAMIDE HCL 10 MG PO TABS: 5.0000 mg | ORAL_TABLET | Freq: Three times a day (TID) | ORAL | Status: DC | PRN

## 2018-05-17 MED ORDER — MIDAZOLAM HCL 5 MG/5ML IJ SOLN
INTRAMUSCULAR | Status: DC | PRN
  Administered 2018-05-17: 1 mg via INTRAVENOUS

## 2018-05-17 MED ORDER — MAGNESIUM HYDROXIDE 400 MG/5ML PO SUSP
30.0000 mL | Freq: Every day | ORAL | Status: DC
  Administered 2018-05-18: 30 mL via ORAL
  Filled 2018-05-17: qty 30

## 2018-05-17 MED ORDER — CELECOXIB 200 MG PO CAPS
400.0000 mg | ORAL_CAPSULE | Freq: Once | ORAL | Status: AC
  Administered 2018-05-17: 400 mg via ORAL

## 2018-05-17 MED ORDER — ACETAMINOPHEN 10 MG/ML IV SOLN
INTRAVENOUS | Status: DC | PRN
  Administered 2018-05-17: 1000 mg via INTRAVENOUS

## 2018-05-17 MED ORDER — OXYCODONE HCL 5 MG PO TABS
5.0000 mg | ORAL_TABLET | ORAL | Status: DC | PRN
  Administered 2018-05-17: 5 mg via ORAL
  Filled 2018-05-17: qty 1

## 2018-05-17 MED ORDER — PROPOFOL 500 MG/50ML IV EMUL
INTRAVENOUS | Status: AC
  Filled 2018-05-17: qty 50

## 2018-05-17 MED ORDER — BUPIVACAINE HCL (PF) 0.25 % IJ SOLN
INTRAMUSCULAR | Status: AC
  Filled 2018-05-17: qty 60

## 2018-05-17 MED ORDER — OXYCODONE HCL 5 MG PO TABS: 10.0000 mg | ORAL_TABLET | ORAL | Status: DC | PRN

## 2018-05-17 MED ORDER — MIDAZOLAM HCL 2 MG/2ML IJ SOLN
INTRAMUSCULAR | Status: AC
  Filled 2018-05-17: qty 2

## 2018-05-17 MED ORDER — METOCLOPRAMIDE HCL 10 MG PO TABS
10.0000 mg | ORAL_TABLET | Freq: Three times a day (TID) | ORAL | Status: DC
  Administered 2018-05-17 – 2018-05-19 (×6): 10 mg via ORAL
  Filled 2018-05-17 (×6): qty 1

## 2018-05-17 MED ORDER — ONDANSETRON HCL 4 MG PO TABS: 4.0000 mg | ORAL_TABLET | Freq: Four times a day (QID) | ORAL | Status: DC | PRN

## 2018-05-17 MED ORDER — GENTAMICIN SULFATE 40 MG/ML IJ SOLN
INTRAMUSCULAR | Status: AC
  Filled 2018-05-17: qty 4

## 2018-05-17 MED ORDER — SODIUM CHLORIDE 0.9 % IV SOLN
INTRAVENOUS | Status: DC | PRN
  Administered 2018-05-17: 60 mL

## 2018-05-17 MED ORDER — MENTHOL 3 MG MT LOZG: 1.0000 | LOZENGE | OROMUCOSAL | Status: DC | PRN

## 2018-05-17 MED ORDER — LACTATED RINGERS IV SOLN
INTRAVENOUS | Status: DC
  Administered 2018-05-17 (×2): via INTRAVENOUS

## 2018-05-17 MED ORDER — GABAPENTIN 300 MG PO CAPS
ORAL_CAPSULE | ORAL | Status: AC
  Administered 2018-05-17: 300 mg via ORAL
  Filled 2018-05-17: qty 1

## 2018-05-17 MED ORDER — TRAMADOL HCL 50 MG PO TABS
50.0000 mg | ORAL_TABLET | ORAL | Status: DC | PRN
  Administered 2018-05-17 – 2018-05-19 (×4): 100 mg via ORAL
  Filled 2018-05-17 (×4): qty 2

## 2018-05-17 MED ORDER — ALUM & MAG HYDROXIDE-SIMETH 200-200-20 MG/5ML PO SUSP: 30.0000 mL | ORAL | Status: DC | PRN

## 2018-05-17 MED ORDER — BISACODYL 10 MG RE SUPP: 10.0000 mg | Freq: Every day | RECTAL | Status: DC | PRN

## 2018-05-17 MED ORDER — BUPIVACAINE HCL (PF) 0.5 % IJ SOLN
INTRAMUSCULAR | Status: AC
  Filled 2018-05-17: qty 10

## 2018-05-17 MED ORDER — PROPOFOL 10 MG/ML IV BOLUS
INTRAVENOUS | Status: AC
  Filled 2018-05-17: qty 20

## 2018-05-17 MED ORDER — DEXAMETHASONE SODIUM PHOSPHATE 10 MG/ML IJ SOLN
INTRAMUSCULAR | Status: AC
  Administered 2018-05-17: 8 mg via INTRAVENOUS
  Filled 2018-05-17: qty 1

## 2018-05-17 MED ORDER — HYDROCHLOROTHIAZIDE 25 MG PO TABS: 25.0000 mg | ORAL_TABLET | Freq: Every day | ORAL | Status: DC

## 2018-05-17 MED ORDER — PROPOFOL 500 MG/50ML IV EMUL
INTRAVENOUS | Status: DC | PRN
  Administered 2018-05-17: 70 ug/kg/min via INTRAVENOUS

## 2018-05-17 MED ORDER — GLYCOPYRROLATE 0.2 MG/ML IJ SOLN
INTRAMUSCULAR | Status: AC
  Filled 2018-05-17: qty 1

## 2018-05-17 MED ORDER — CELECOXIB 200 MG PO CAPS
200.0000 mg | ORAL_CAPSULE | Freq: Two times a day (BID) | ORAL | Status: DC
  Administered 2018-05-17 – 2018-05-19 (×4): 200 mg via ORAL
  Filled 2018-05-17 (×4): qty 1

## 2018-05-17 MED ORDER — DIPHENHYDRAMINE HCL 12.5 MG/5ML PO ELIX: 12.5000 mg | ORAL_SOLUTION | ORAL | Status: DC | PRN

## 2018-05-17 MED ORDER — SODIUM CHLORIDE 0.9 % IV SOLN
INTRAVENOUS | Status: DC | PRN
  Administered 2018-05-17: 30 ug/min via INTRAVENOUS

## 2018-05-17 MED ORDER — HYDROMORPHONE HCL 1 MG/ML IJ SOLN: 0.5000 mg | INTRAMUSCULAR | Status: DC | PRN

## 2018-05-17 MED ORDER — BUPIVACAINE HCL (PF) 0.25 % IJ SOLN
INTRAMUSCULAR | Status: DC | PRN
  Administered 2018-05-17: 30 mL

## 2018-05-17 MED ORDER — CEFAZOLIN SODIUM-DEXTROSE 2-4 GM/100ML-% IV SOLN
2.0000 g | Freq: Four times a day (QID) | INTRAVENOUS | Status: AC
  Administered 2018-05-17 – 2018-05-18 (×4): 2 g via INTRAVENOUS
  Filled 2018-05-17 (×4): qty 100

## 2018-05-17 MED ORDER — GLYCOPYRROLATE 0.2 MG/ML IJ SOLN
INTRAMUSCULAR | Status: DC | PRN
  Administered 2018-05-17: 0.2 mg via INTRAVENOUS

## 2018-05-17 MED ORDER — FLUTICASONE PROPIONATE 50 MCG/ACT NA SUSP: 2.0000 | Freq: Every day | NASAL | Status: DC | PRN

## 2018-05-17 SURGICAL SUPPLY — 71 items
ATTUNE MED DOME PAT 38 KNEE (Knees) ×1 IMPLANT
ATTUNE MED DOME PAT 38MM KNEE (Knees) ×1 IMPLANT
ATTUNE PS FEM RT SZ 7 CEM KNEE (Femur) ×2 IMPLANT
ATTUNE PSRP INSR SZ7 7 KNEE (Insert) ×1 IMPLANT
ATTUNE PSRP INSR SZ7 7MM KNEE (Insert) ×1 IMPLANT
BASE TIBIAL ROT PLAT SZ 7 KNEE (Knees) IMPLANT
BATTERY INSTRU NAVIGATION (MISCELLANEOUS) ×12 IMPLANT
BLADE SAW 70X12.5 (BLADE) ×3 IMPLANT
BLADE SAW 90X13X1.19 OSCILLAT (BLADE) ×3 IMPLANT
BLADE SAW 90X25X1.19 OSCILLAT (BLADE) ×3 IMPLANT
CANISTER SUCT 1200ML W/VALVE (MISCELLANEOUS) ×3 IMPLANT
CANISTER SUCT 3000ML PPV (MISCELLANEOUS) ×6 IMPLANT
CEMENT HV SMART SET (Cement) ×6 IMPLANT
COOLER POLAR GLACIER W/PUMP (MISCELLANEOUS) ×3 IMPLANT
COVER WAND RF STERILE (DRAPES) ×1 IMPLANT
CUFF TOURN 24 STER (MISCELLANEOUS) IMPLANT
CUFF TOURN 30 STER DUAL PORT (MISCELLANEOUS) ×2 IMPLANT
DRAPE SHEET LG 3/4 BI-LAMINATE (DRAPES) ×3 IMPLANT
DRSG DERMACEA 8X12 NADH (GAUZE/BANDAGES/DRESSINGS) ×3 IMPLANT
DRSG OPSITE POSTOP 4X14 (GAUZE/BANDAGES/DRESSINGS) ×3 IMPLANT
DRSG TEGADERM 4X4.75 (GAUZE/BANDAGES/DRESSINGS) ×3 IMPLANT
DURAPREP 26ML APPLICATOR (WOUND CARE) ×6 IMPLANT
ELECT CAUTERY BLADE 6.4 (BLADE) ×3 IMPLANT
ELECT REM PT RETURN 9FT ADLT (ELECTROSURGICAL) ×3
ELECTRODE REM PT RTRN 9FT ADLT (ELECTROSURGICAL) ×1 IMPLANT
EX-PIN ORTHOLOCK NAV 4X150 (PIN) ×6 IMPLANT
GLOVE BIOGEL M STRL SZ7.5 (GLOVE) ×6 IMPLANT
GLOVE BIOGEL PI IND STRL 7.5 (GLOVE) IMPLANT
GLOVE BIOGEL PI INDICATOR 7.5 (GLOVE) ×12
GLOVE INDICATOR 8.0 STRL GRN (GLOVE) ×3 IMPLANT
GOWN STRL REUS W/ TWL LRG LVL3 (GOWN DISPOSABLE) ×2 IMPLANT
GOWN STRL REUS W/TWL LRG LVL3 (GOWN DISPOSABLE) ×4
HEMOVAC 400CC 10FR (MISCELLANEOUS) ×3 IMPLANT
HOLDER FOLEY CATH W/STRAP (MISCELLANEOUS) ×3 IMPLANT
HOOD PEEL AWAY FLYTE STAYCOOL (MISCELLANEOUS) ×6 IMPLANT
KIT TURNOVER KIT A (KITS) ×3 IMPLANT
KNIFE SCULPS 14X20 (INSTRUMENTS) ×3 IMPLANT
LABEL OR SOLS (LABEL) ×3 IMPLANT
MANIFOLD NEPTUNE WASTE (CANNULA) ×3 IMPLANT
NDL SAFETY ECLIPSE 18X1.5 (NEEDLE) ×1 IMPLANT
NDL SPNL 20GX3.5 QUINCKE YW (NEEDLE) ×2 IMPLANT
NEEDLE HYPO 18GX1.5 SHARP (NEEDLE) ×2
NEEDLE SPNL 20GX3.5 QUINCKE YW (NEEDLE) ×6 IMPLANT
NS IRRIG 500ML POUR BTL (IV SOLUTION) ×3 IMPLANT
PACK TOTAL KNEE (MISCELLANEOUS) ×3 IMPLANT
PAD WRAPON POLAR KNEE (MISCELLANEOUS) ×1 IMPLANT
PENCIL SMOKE ULTRAEVAC 22 CON (MISCELLANEOUS) ×3 IMPLANT
PIN DRILL QUICK PACK ×3 IMPLANT
PIN FIXATION 1/8DIA X 3INL (PIN) ×9 IMPLANT
PULSAVAC PLUS IRRIG FAN TIP (DISPOSABLE) ×3
SOL .9 NS 3000ML IRR  AL (IV SOLUTION) ×2
SOL .9 NS 3000ML IRR UROMATIC (IV SOLUTION) ×1 IMPLANT
SOL PREP PVP 2OZ (MISCELLANEOUS) ×3
SOLUTION PREP PVP 2OZ (MISCELLANEOUS) ×1 IMPLANT
SPONGE DRAIN TRACH 4X4 STRL 2S (GAUZE/BANDAGES/DRESSINGS) ×3 IMPLANT
STAPLER SKIN PROX 35W (STAPLE) ×3 IMPLANT
STOCKINETTE IMPERV 14X48 (MISCELLANEOUS) IMPLANT
STRAP TIBIA SHORT (MISCELLANEOUS) ×3 IMPLANT
SUCTION FRAZIER HANDLE 10FR (MISCELLANEOUS) ×2
SUCTION TUBE FRAZIER 10FR DISP (MISCELLANEOUS) ×1 IMPLANT
SUT VIC AB 0 CT1 36 (SUTURE) ×3 IMPLANT
SUT VIC AB 1 CT1 36 (SUTURE) ×6 IMPLANT
SUT VIC AB 2-0 CT2 27 (SUTURE) ×3 IMPLANT
SYR 20CC LL (SYRINGE) ×3 IMPLANT
SYR 30ML LL (SYRINGE) ×6 IMPLANT
TIBIAL BASE ROT PLAT SZ 7 KNEE (Knees) ×3 IMPLANT
TIP FAN IRRIG PULSAVAC PLUS (DISPOSABLE) ×1 IMPLANT
TOWEL OR 17X26 4PK STRL BLUE (TOWEL DISPOSABLE) ×3 IMPLANT
TOWER CARTRIDGE SMART MIX (DISPOSABLE) ×3 IMPLANT
TRAY FOLEY MTR SLVR 16FR STAT (SET/KITS/TRAYS/PACK) ×3 IMPLANT
WRAPON POLAR PAD KNEE (MISCELLANEOUS) ×3

## 2018-05-17 NOTE — Anesthesia Preprocedure Evaluation (Signed)
Anesthesia Evaluation  Patient identified by MRN, date of birth, ID band Patient awake    Reviewed: Allergy & Precautions, H&P , NPO status , Patient's Chart, lab work & pertinent test results, reviewed documented beta blocker date and time   History of Anesthesia Complications (+) PONV and history of anesthetic complications  Airway Mallampati: II  TM Distance: >3 FB Neck ROM: full    Dental  (+) Caps, Dental Advidsory Given, Teeth Intact   Pulmonary neg shortness of breath, sleep apnea and Continuous Positive Airway Pressure Ventilation , neg COPD, neg recent URI,           Cardiovascular Exercise Tolerance: Good hypertension, (-) angina(-) CAD, (-) Past MI, (-) Cardiac Stents and (-) CABG (-) dysrhythmias (-) Valvular Problems/Murmurs     Neuro/Psych negative neurological ROS  negative psych ROS   GI/Hepatic Neg liver ROS, GERD  Controlled and Medicated,  Endo/Other  negative endocrine ROS  Renal/GU negative Renal ROS  negative genitourinary   Musculoskeletal   Abdominal   Peds  Hematology negative hematology ROS (+)   Anesthesia Other Findings Past Medical History: No date: Acid reflux No date: Hypertension No date: PONV (postoperative nausea and vomiting) 2013: Sleep apnea   Reproductive/Obstetrics negative OB ROS                             Anesthesia Physical  Anesthesia Plan  ASA: II  Anesthesia Plan: Spinal   Post-op Pain Management:    Induction:   PONV Risk Score and Plan: Propofol infusion and TIVA  Airway Management Planned: Simple Face Mask and Natural Airway  Additional Equipment:   Intra-op Plan:   Post-operative Plan:   Informed Consent: I have reviewed the patients History and Physical, chart, labs and discussed the procedure including the risks, benefits and alternatives for the proposed anesthesia with the patient or authorized representative who has  indicated his/her understanding and acceptance.     Dental Advisory Given  Plan Discussed with: Anesthesiologist, CRNA and Surgeon  Anesthesia Plan Comments:         Anesthesia Quick Evaluation  

## 2018-05-17 NOTE — Op Note (Signed)
OPERATIVE NOTE  DATE OF SURGERY:  05/17/2018  PATIENT NAME:  Frank Stephens   DOB: 16-Oct-1950  MRN: [id number]  PRE-OPERATIVE DIAGNOSIS: Degenerative arthrosis of the right knee, primary  POST-OPERATIVE DIAGNOSIS:  Same  PROCEDURE:  Right total knee arthroplasty using computer-assisted navigation  SURGEON:  Jena Gauss. M.D.  ASSISTANT: Lewis Moccasin, PA-C (present and scrubbed throughout the case, critical for assistance with exposure, retraction, instrumentation, and closure)  ANESTHESIA: spinal  ESTIMATED BLOOD LOSS: 50 mL  FLUIDS REPLACED: 1400 mL of crystalloid  TOURNIQUET TIME: 96 minutes  DRAINS: 2 medium Hemovac drains  SOFT TISSUE RELEASES: Anterior cruciate ligament, posterior cruciate ligament, deep and superficial medial collateral ligament, patellofemoral ligament  IMPLANTS UTILIZED: DePuy Attune size 7 posterior stabilized femoral component (cemented), size 7 rotating platform tibial component (cemented), 38 mm medialized dome patella (cemented), and a 7 mm stabilized rotating platform polyethylene insert.  INDICATIONS FOR SURGERY: Frank Stephens is a 68 y.o. year old male with a long history of progressive knee pain. X-rays demonstrated severe degenerative changes in tricompartmental fashion. The patient had not seen any significant improvement despite conservative nonsurgical intervention. After discussion of the risks and benefits of surgical intervention, the patient expressed understanding of the risks benefits and agree with plans for total knee arthroplasty.   The risks, benefits, and alternatives were discussed at length including but not limited to the risks of infection, bleeding, nerve injury, stiffness, blood clots, the need for revision surgery, cardiopulmonary complications, among others, and they were willing to proceed.  PROCEDURE IN DETAIL: The patient was brought into the operating room and, after adequate spinal anesthesia was  achieved, a tourniquet was placed on the patient's upper thigh. The patient's knee and leg were cleaned and prepped with alcohol and DuraPrep and draped in the usual sterile fashion. A "timeout" was performed as per usual protocol. The lower extremity was exsanguinated using an Esmarch, and the tourniquet was inflated to 300 mmHg. An anterior longitudinal incision was made followed by a standard mid vastus approach. The deep fibers of the medial collateral ligament were elevated in a subperiosteal fashion off of the medial flare of the tibia so as to maintain a continuous soft tissue sleeve. The patella was subluxed laterally and the patellofemoral ligament was incised. Inspection of the knee demonstrated severe degenerative changes with full-thickness loss of articular cartilage. Osteophytes were debrided using a rongeur. Anterior and posterior cruciate ligaments were excised. Two 4.0 mm Schanz pins were inserted in the femur and into the tibia for attachment of the array of trackers used for computer-assisted navigation. Hip center was identified using a circumduction technique. Distal landmarks were mapped using the computer. The distal femur and proximal tibia were mapped using the computer. The distal femoral cutting guide was positioned using computer-assisted navigation so as to achieve a 5 distal valgus cut. The femur was sized and it was felt that a size 7 femoral component was appropriate. A size 7 femoral cutting guide was positioned and the anterior cut was performed and verified using the computer. This was followed by completion of the posterior and chamfer cuts. Femoral cutting guide for the central box was then positioned in the center box cut was performed.  Attention was then directed to the proximal tibia. Medial and lateral menisci were excised. The extramedullary tibial cutting guide was positioned using computer-assisted navigation so as to achieve a 0 varus-valgus alignment and 3  posterior slope. The cut was performed and verified using the computer. The proximal  tibia was sized and it was felt that a size 7 tibial tray was appropriate. Tibial and femoral trials were inserted followed by insertion of a 5 mm polyethylene insert. The knee was felt to be tight medially. A Cobb elevator was used to elevate the superficial fibers of the medial collateral ligament.  The 5 mm polyethylene insert was replaced by a 7 mm polyethylene insert.  This allowed for excellent mediolateral soft tissue balancing both in flexion and in full extension. Finally, the patella was cut and prepared so as to accommodate a 38 mm medialized dome patella. A patella trial was placed and the knee was placed through a range of motion with excellent patellar tracking appreciated. The femoral trial was removed after debridement of posterior osteophytes. The central post-hole for the tibial component was reamed followed by insertion of a keel punch. Tibial trials were then removed. Cut surfaces of bone were irrigated with copious amounts of normal saline with antibiotic solution using pulsatile lavage and then suctioned dry. Polymethylmethacrylate cement was prepared in the usual fashion using a vacuum mixer. Cement was applied to the cut surface of the proximal tibia as well as along the undersurface of a size 7 rotating platform tibial component. Tibial component was positioned and impacted into place. Excess cement was removed using Personal assistant. Cement was then applied to the cut surfaces of the femur as well as along the posterior flanges of the size 7 femoral component. The femoral component was positioned and impacted into place. Excess cement was removed using Personal assistant. A 7 mm polyethylene trial was inserted and the knee was brought into full extension with steady axial compression applied. Finally, cement was applied to the backside of a 38 mm medialized dome patella and the patellar component was positioned  and patellar clamp applied. Excess cement was removed using Personal assistant. After adequate curing of the cement, the tourniquet was deflated after a total tourniquet time of 96 minutes. Hemostasis was achieved using electrocautery. The knee was irrigated with copious amounts of normal saline with antibiotic solution using pulsatile lavage and then suctioned dry. 20 mL of 1.3% Exparel and 60 mL of 0.25% Marcaine in 40 mL of normal saline was injected along the posterior capsule, medial and lateral gutters, and along the arthrotomy site. A 7 mm stabilized rotating platform polyethylene insert was inserted and the knee was placed through a range of motion with excellent mediolateral soft tissue balancing appreciated and excellent patellar tracking noted. 2 medium drains were placed in the wound bed and brought out through separate stab incisions. The medial parapatellar portion of the incision was reapproximated using interrupted sutures of #1 Vicryl. Subcutaneous tissue was approximated in layers using first #0 Vicryl followed #2-0 Vicryl. The skin was approximated with skin staples. A sterile dressing was applied.  The patient tolerated the procedure well and was transported to the recovery room in stable condition.    Deckard Stuber P. Angie Fava., M.D.

## 2018-05-17 NOTE — Anesthesia Post-op Follow-up Note (Signed)
Anesthesia QCDR form completed.        

## 2018-05-17 NOTE — Transfer of Care (Signed)
Immediate Anesthesia Transfer of Care Note  Patient: Frank Stephens  Procedure(s) Performed: RIGHT TOTAL KNEE ARTHROPLASTY (Right Knee)  Patient Location: PACU  Anesthesia Type:Spinal  Level of Consciousness: awake, alert , oriented and patient cooperative  Airway & Oxygen Therapy: Patient Spontanous Breathing and Patient connected to nasal cannula oxygen  Post-op Assessment: Report given to RN and Post -op Vital signs reviewed and stable  Post vital signs: Reviewed and stable  Last Vitals:  Vitals Value Taken Time  BP    Temp    Pulse 110 05/17/2018 10:37 AM  Resp    SpO2 96 % 05/17/2018 10:37 AM  Vitals shown include unvalidated device data.  Last Pain:  Vitals:   05/17/18 0612  TempSrc: Oral  PainSc: 0-No pain         Complications: No apparent anesthesia complications

## 2018-05-17 NOTE — H&P (Signed)
The patient has been re-examined, and the chart reviewed, and there have been no interval changes to the documented history and physical.    The risks, benefits, and alternatives have been discussed at length. The patient expressed understanding of the risks benefits and agreed with plans for surgical intervention.  Valora Norell P. Giorgi Debruin, Jr. M.D.    

## 2018-05-17 NOTE — Discharge Instructions (Signed)
°  Instructions after Total Knee Replacement ° ° James P. Hooten, Jr., M.D.    ° Dept. of Orthopaedics & Sports Medicine ° [facility] ° [address] ° [place], [place]  [address] ° Phone: [phone]   Fax: [phone] ° °  °DIET: °• Drink plenty of non-alcoholic fluids. °• Resume your normal diet. Include foods high in fiber. ° °ACTIVITY:  °• You may use crutches or a walker with weight-bearing as tolerated, unless instructed otherwise. °• You may be weaned off of the walker or crutches by your Physical Therapist.  °• Do NOT place pillows under the knee. Anything placed under the knee could limit your ability to straighten the knee.   °• Continue doing gentle exercises. Exercising will reduce the pain and swelling, increase motion, and prevent muscle weakness.   °• Please continue to use the TED compression stockings for 6 weeks. You may remove the stockings at night, but should reapply them in the morning. °• Do not drive or operate any equipment until instructed. ° °WOUND CARE:  °• Continue to use the PolarCare or ice packs periodically to reduce pain and swelling. °• You may bathe or shower after the staples are removed at the first office visit following surgery. ° °MEDICATIONS: °• You may resume your regular medications. °• Please take the pain medication as prescribed on the medication. °• Do not take pain medication on an empty stomach. °• You have been given a prescription for a blood thinner (Lovenox or Coumadin). Please take the medication as instructed. (NOTE: After completing a 2 week course of Lovenox, take one Enteric-coated aspirin once a day. This along with elevation will help reduce the possibility of phlebitis in your operated leg.) °• Do not drive or drink alcoholic beverages when taking pain medications. ° °CALL THE OFFICE FOR: °• Temperature above 101 degrees °• Excessive bleeding or drainage on the dressing. °• Excessive swelling, coldness, or paleness of the toes. °• Persistent  nausea and vomiting. ° °FOLLOW-UP:  °• You should have an appointment to return to the office in 10-14 days after surgery. °• Arrangements have been made for continuation of Physical Therapy (either home therapy or outpatient therapy). °  °

## 2018-05-17 NOTE — Evaluation (Signed)
Physical Therapy Evaluation Patient Details Name: Frank Stephens MRN: [id number] DOB: [birth year] Today's Date: 05/17/2018   History of Present Illness  Pt is a 68 yo male s/p elective R TKA. PMH includes HTN.   Clinical Impression  Pt presents with deficits in strength, transfers, mobility, gait, R knee ROM, and activity tolerance but overall performed very well during PT evaluation especially considering POD#0 status.  Pt was Mod Ind with bed mobility tasks and CGA with transfers demonstrating good control and stability.  Pt was able to amb 10' in the room with a RW and step-to sequencing with slow, cautious cadence but was steady throughout with only minimal R knee pain.  Pt should progress very well in acute care and will benefit from HHPT services upon discharge to safely address above deficits for decreased caregiver assistance and eventual return to PLOF.       Follow Up Recommendations Home health PT    Equipment Recommendations  Rolling walker with 5" wheels    Recommendations for Other Services       Precautions / Restrictions Precautions Precautions: Knee Required Braces or Orthoses: Other Brace Other Brace: Pt able to do 10 Ind RLE SLRs without extensor lag, no KI required  Restrictions Weight Bearing Restrictions: Yes RLE Weight Bearing: Weight bearing as tolerated      Mobility  Bed Mobility Overal bed mobility: Modified Independent             General bed mobility comments: Increased time and effort during sup to sit but no physical assistance needed  Transfers Overall transfer level: Needs assistance Equipment used: Rolling walker (2 wheeled) Transfers: Sit to/from Stand Sit to Stand: Min guard         General transfer comment: Mod verbal cues for sequencing with pt able to stand with good control and stability; pt with min dizziness upon sitting with BP taken at 155/93 with HR 110 and SpO2 100%; resolved quickly in  sitting.  Ambulation/Gait Ambulation/Gait assistance: Min guard Gait Distance (Feet): 10 Feet Assistive device: Rolling walker (2 wheeled) Gait Pattern/deviations: Step-to pattern;Decreased step length - left;Decreased stance time - right Gait velocity: decreased   General Gait Details: Good stability during amb without adverse symptoms other than mild R knee pain.    Stairs            Wheelchair Mobility    Modified Rankin (Stroke Patients Only)       Balance Overall balance assessment: No apparent balance deficits (not formally assessed)                                           Pertinent Vitals/Pain Pain Assessment: 0-10 Pain Score: 1  Pain Location: R knee Pain Descriptors / Indicators: Burning Pain Intervention(s): Premedicated before session;Monitored during session    Home Living Family/patient expects to be discharged to:: Private residence Living Arrangements: Spouse/significant other Available Help at Discharge: Family;Available 24 hours/day Type of Home: House Home Access: Stairs to enter Entrance Stairs-Rails: Right;Left(Too wide for both) Entrance Stairs-Number of Steps: 5 Home Layout: One level Home Equipment: Bedside commode      Prior Function Level of Independence: Independent         Comments: 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, 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, 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, Ind with ADLs     Hand Dominance   Dominant Hand: Right  Extremity/Trunk Assessment   Upper Extremity Assessment Upper Extremity Assessment: Defer to OT evaluation    Lower Extremity Assessment Lower Extremity Assessment: Generalized weakness;RLE deficits/detail RLE Deficits / Details: Good RLE ankle DF/PF strength against manual resistance, able to do Ind RLE SLRs without extensor lag RLE: Unable to fully assess due to pain RLE Sensation: WNL       Communication   Communication: No difficulties  Cognition Arousal/Alertness:  Awake/alert Behavior During Therapy: WFL for tasks assessed/performed Overall Cognitive Status: Within Functional Limits for tasks assessed                                        General Comments      Exercises Total Joint Exercises Ankle Circles/Pumps: Strengthening;Both;10 reps Quad Sets: Strengthening;AROM;Both;10 reps Gluteal Sets: Strengthening;Both;10 reps Heel Slides: AROM;Left;10 reps Hip ABduction/ADduction: AROM;10 reps;Right Straight Leg Raises: AROM;Right;10 reps Long Arc Quad: AROM;Strengthening;Right;10 reps;15 reps Knee Flexion: AROM;Strengthening;Right;10 reps;15 reps Goniometric ROM: R knee AROM: 0-72 deg  Marching in Standing: AROM;Both;10 reps;Standing Other Exercises Other Exercises: Pt education provided on avoiding CKC twisting on the R knee during 90 deg turns to the right Other Exercises: HEP education provided per handout Other Exercises: Positioning education provided with pt and spouse   Assessment/Plan    PT Assessment Patient needs continued PT services  PT Problem List Decreased strength;Decreased range of motion;Decreased activity tolerance;Decreased knowledge of use of DME       PT Treatment Interventions DME instruction;Gait training;Stair training;Functional mobility training;Therapeutic activities;Therapeutic exercise;Balance training;Patient/family education    PT Goals (Current goals can be found in the Care Plan section)  Acute Rehab PT Goals Patient Stated Goal: To get moving and progress as fast as I can PT Goal Formulation: With patient Time For Goal Achievement: 05/30/18 Potential to Achieve Goals: Good    Frequency BID   Barriers to discharge        Co-evaluation               AM-PAC PT "6 Clicks" Mobility  Outcome Measure Help needed turning from your back to your side while in a flat bed without using bedrails?: A Little Help needed moving from lying on your back to sitting on the side of a flat  bed without using bedrails?: A Little Help needed moving to and from a bed to a chair (including a wheelchair)?: A Little Help needed standing up from a chair using your arms (e.g., wheelchair or bedside chair)?: A Little Help needed to walk in hospital room?: A Little Help needed climbing 3-5 steps with a railing? : A Little 6 Click Score: 18    End of Session Equipment Utilized During Treatment: Gait belt Activity Tolerance: Patient tolerated treatment well Patient left: in chair;with call bell/phone within reach;with chair alarm set;with SCD's reapplied;Other (comment);with family/visitor present(Polar care to right knee) Nurse Communication: Mobility status PT Visit Diagnosis: Other abnormalities of gait and mobility (R26.89);Muscle weakness (generalized) (M62.81)    Time: 1503-1600 PT Time Calculation (min) (ACUTE ONLY): 57 min   Charges:   PT Evaluation $PT Eval Low Complexity: 1 Low PT Treatments $Therapeutic Exercise: 8-22 mins $Therapeutic Activity: 8-22 mins        D. Elly Modena PT, DPT 05/17/18, 4:59 PM

## 2018-05-17 NOTE — Anesthesia Procedure Notes (Signed)
Spinal  Patient location during procedure: OR Staffing Resident/CRNA: Bernardo Heater, CRNA Performed: resident/CRNA  Preanesthetic Checklist Completed: patient identified, site marked, surgical consent, pre-op evaluation, timeout performed, IV checked, risks and benefits discussed and monitors and equipment checked Spinal Block Patient position: sitting Prep: ChloraPrep Patient monitoring: heart rate, continuous pulse ox, blood pressure and cardiac monitor Approach: midline Location: L3-4 Injection technique: single-shot Needle Needle type: Introducer and Pencan  Needle gauge: 24 G Needle length: 9 cm Additional Notes Negative paresthesia. Negative blood return. Positive free-flowing CSF. Expiration date of kit checked and confirmed. Patient tolerated procedure well, without complications.

## 2018-05-18 MED ORDER — OXYCODONE HCL 5 MG PO TABS: 5.0000 mg | ORAL_TABLET | ORAL | 0 refills | Status: DC | PRN

## 2018-05-18 MED ORDER — TRAMADOL HCL 50 MG PO TABS: 50.0000 mg | ORAL_TABLET | Freq: Four times a day (QID) | ORAL | 0 refills | Status: DC | PRN

## 2018-05-18 MED ORDER — ENOXAPARIN SODIUM 40 MG/0.4ML ~~LOC~~ SOLN: 40.0000 mg | SUBCUTANEOUS | 0 refills | Status: DC

## 2018-05-18 NOTE — Progress Notes (Signed)
Dr. Ernest Pine paged to notify of pt unable to urinate. Bladder scanned to reveal . Orders to bladder scan at 10pm, continue fluids

## 2018-05-18 NOTE — Discharge Summary (Signed)
Physician Discharge Summary  Patient ID: Frank Stephens MRN: [id number] DOB/AGE: October 10, 1950 68 y.o.  Admit date: 05/17/2018 Discharge date: 05/19/2018  Admission Diagnoses:  PRIMARY OSTEOARTHRITIS OF RIGHT KNEE   Discharge Diagnoses: Patient Active Problem List   Diagnosis Date Noted  . Benign essential hypertension 05/17/2018  . Pure hypercholesterolemia 05/17/2018  . Total knee replacement status 05/17/2018  . Primary osteoarthritis of both knees 02/21/2018  . Goiter 09/09/2013  . Thyroid nodule 09/09/2013  . GERD (gastroesophageal reflux disease) 07/28/2013  . Sleep apnea, obstructive 07/28/2013    Past Medical History:  Diagnosis Date  . Acid reflux   . Hypertension   . PONV (postoperative nausea and vomiting)   . Sleep apnea 2013     Transfusion: No transfusions during this admission   Consultants (if any):   Discharged Condition: Improved  Hospital Course: Frank Stephens is an 68 y.o. male who was admitted 05/17/2018 with a diagnosis of degenerative arthrosis right knee and went to the operating room on 05/17/2018 and underwent the above named procedures.    Surgeries:Procedure(s): RIGHT TOTAL KNEE ARTHROPLASTY on 05/17/2018  PRE-OPERATIVE DIAGNOSIS: Degenerative arthrosis of the right knee, primary  POST-OPERATIVE DIAGNOSIS:  Same  PROCEDURE:  Right total knee arthroplasty using computer-assisted navigation  SURGEON:  Jena Gauss. M.D.  ASSISTANT: Lewis Moccasin, PA-C (present and scrubbed throughout the case, critical for assistance with exposure, retraction, instrumentation, and closure)  ANESTHESIA: spinal  ESTIMATED BLOOD LOSS: 50 mL  FLUIDS REPLACED: 1400 mL of crystalloid  TOURNIQUET TIME: 96 minutes  DRAINS: 2 medium Hemovac drains  SOFT TISSUE RELEASES: Anterior cruciate ligament, posterior cruciate ligament, deep and superficial medial collateral ligament, patellofemoral ligament  IMPLANTS UTILIZED: DePuy  Attune size 7 posterior stabilized femoral component (cemented), size 7 rotating platform tibial component (cemented), 38 mm medialized dome patella (cemented), and a 7 mm stabilized rotating platform polyethylene insert.  INDICATIONS FOR SURGERY: Frank Stephens is a 76 y.o. year old male with a long history of progressive knee pain. X-rays demonstrated severe degenerative changes in tricompartmental fashion. The patient had not seen any significant improvement despite conservative nonsurgical intervention. After discussion of the risks and benefits of surgical intervention, the patient expressed understanding of the risks benefits and agree with plans for total knee arthroplasty.   The risks, benefits, and alternatives were discussed at length including but not limited to the risks of infection, bleeding, nerve injury, stiffness, blood clots, the need for revision surgery, cardiopulmonary complications, among others, and they were willing to proceed.  Patient tolerated the surgery well. No complications .Patient was taken to PACU where she was stabilized and then transferred to the orthopedic floor.  Patient started on Lovenox 30 mg q 12 hrs. Foot pumps applied bilaterally at 80 mm hgb. Heels elevated off bed with rolled towels. No evidence of DVT. Calves non tender. Negative Homan. Physical therapy started on day #1 for gait training and transfer with OT starting on  day #1 for ADL and assisted devices. Patient has done well with therapy. Ambulated greater than 200 feet upon being discharged.  Was able to ascend and descend 4 steps safely and independently  Patient's IV And Foley were discontinued on day #1 with Hemovac being discontinued on day #2. Dressing was changed on day 2 prior to patient being discharged   He was given perioperative antibiotics:  Anti-infectives (From admission, onward)   Start     Dose/Rate Route Frequency Ordered Stop   05/17/18 1400  ceFAZolin (ANCEF) IVPB  2g/100 mL premix     2 g 200 mL/hr over 30 Minutes Intravenous Every 6 hours 05/17/18 1249 05/18/18 1359   05/17/18 0757  gentamicin (GARAMYCIN) 80 mg in sodium chloride 0.9 % 500 mL irrigation  Status:  Discontinued       As needed 05/17/18 0822 05/17/18 1035   05/17/18 0600  ceFAZolin (ANCEF) IVPB 2g/100 mL premix     2 g 200 mL/hr over 30 Minutes Intravenous On call to O.R. 05/16/18 2222 05/17/18 0746   05/17/18 0549  ceFAZolin (ANCEF) 2-4 GM/100ML-% IVPB    Note to Pharmacy:  Frank Stephens  : cabinet override      05/17/18 0549 05/17/18 0734    .  He was fitted with AV 1 compression foot pump devices, instructed on heel pumps, early ambulation, and fitted with TED stockings bilaterally for DVT prophylaxis.  He benefited maximally from the hospital stay and there were no complications.    Recent vital signs:  Vitals:   05/18/18 0419 05/18/18 0725  BP: 121/81 132/79  Pulse: 81 84  Resp: 19 17  Temp: 97.7 F (36.5 C) (!) 97.5 F (36.4 C)  SpO2: 95% 97%    Recent laboratory studies:  Lab Results  Component Value Date   HGB 15.6 05/05/2018   HGB 15.6 12/15/2012   Lab Results  Component Value Date   WBC 7.6 05/05/2018   PLT 231 05/05/2018   Lab Results  Component Value Date   INR 0.93 05/05/2018   Lab Results  Component Value Date   NA 138 05/05/2018   K 3.5 05/05/2018   CL 102 05/05/2018   CO2 29 05/05/2018   BUN 21 05/05/2018   CREATININE 0.92 05/05/2018   GLUCOSE 108 (H) 05/05/2018    Discharge Medications:   Allergies as of 05/18/2018      Reactions   Ace Inhibitors Cough   Other Other (See Comments)   Latex Band-Aid causes irritation to the skin& localized swelling.      Medication List    STOP taking these medications   aspirin 81 MG tablet     TAKE these medications   enoxaparin 40 MG/0.4ML injection Commonly known as:  LOVENOX Inject 0.4 mLs (40 mg total) into the skin daily for 14 days. Start taking on:  May 20, 2018    fluticasone 50 MCG/ACT nasal spray Commonly known as:  FLONASE Place 2 sprays into both nostrils daily as needed for allergies or rhinitis.   hydrochlorothiazide 25 MG tablet Commonly known as:  HYDRODIURIL Take 25 mg by mouth daily.   losartan 50 MG tablet Commonly known as:  COZAAR Take 50 mg by mouth daily.   multivitamin tablet Take 1 tablet by mouth daily.   OSTEO BI-FLEX ADV JOINT SHIELD Tabs Take 2 tablets by mouth daily.   oxyCODONE 5 MG immediate release tablet Commonly known as:  Oxy IR/ROXICODONE Take 1 tablet (5 mg total) by mouth every 4 (four) hours as needed for moderate pain (pain score 4-6).   pantoprazole 40 MG tablet Commonly known as:  PROTONIX Take 40 mg by mouth daily.   traMADol 50 MG tablet Commonly known as:  ULTRAM Take 1-2 tablets (50-100 mg total) by mouth every 6 (six) hours as needed for moderate pain.            Durable Medical Equipment  (From admission, onward)         Start     Ordered   05/17/18 1250  DME Walker rolling  Once    Question:  Patient needs a walker to treat with the following condition  Answer:  Total knee replacement status   05/17/18 1249   05/17/18 1250  DME Bedside commode  Once    Question:  Patient needs a bedside commode to treat with the following condition  Answer:  Total knee replacement status   05/17/18 1249          Diagnostic Studies: Dg Knee Right Port  Result Date: 05/17/2018 CLINICAL DATA:  68 year old male post total right knee replacement. Initial encounter. EXAM: PORTABLE RIGHT KNEE - 1-2 VIEW COMPARISON:  None. FINDINGS: Post total right knee replacement which appears in satisfactory position without complication noted. Pin tracks distal femur and proximal tibia. Drain is in place. Vascular calcifications. IMPRESSION: Post total right knee replacement. Electronically Signed   By: Lacy Duverney M.D.   On: 05/17/2018 11:08    Disposition:   Discharge Instructions    Increase activity  slowly   Complete by:  As directed       Follow-up Information    Tera Partridge, PA On 06/01/2018.   Specialty:  Physician Assistant Why:  at 9:15am Contact information: [address] [place] [address] [phone]        Donato Heinz, MD On 06/29/2018.   Specialty:  Orthopedic Surgery Why:  at 11:15am Contact information: [address] [facility] [address] [facility] [place] [address] [phone]            Signed: Tera Partridge 05/18/2018, 7:38 AM

## 2018-05-18 NOTE — Progress Notes (Signed)
Physical Therapy Treatment Patient Details Name: Frank Stephens MRN: [id number] DOB: 10/17/1950 Today's Date: 05/18/2018    History of Present Illness Pt is a 68 yo male s/p elective R TKA. PMH includes HTN.     PT Comments    Pt agreeable to PT; denies R knee pain. Pt demonstrates excellent mobility with ambulation and stair climbing. Pt self limits use of RLE with STS transfers initially, but with education pt able to demonstrate much improved STS using RLE nearly equally to L in terms of range. Also demonstrates good strength using proper eccentric control without use of arms. Continue PT to progress R knee range of motion and strength to improve functional mobility towards baseline and allow for an optimal, safe transition home.    Follow Up Recommendations  Home health PT     Equipment Recommendations  Rolling walker with 5" wheels    Recommendations for Other Services       Precautions / Restrictions Precautions Precautions: Knee Restrictions Weight Bearing Restrictions: Yes RLE Weight Bearing: Weight bearing as tolerated    Mobility  Bed Mobility               General bed mobility comments: not tested; up in chair  Transfers Overall transfer level: Needs assistance Equipment used: Rolling walker (2 wheeled) Transfers: Sit to/from Stand Sit to Stand: Supervision         General transfer comment: cues for improved use of RLE; demonstrates well post education  Ambulation/Gait Ambulation/Gait assistance: Supervision Gait Distance (Feet): 370 Feet Assistive device: Rolling walker (2 wheeled) Gait Pattern/deviations: Step-through pattern Gait velocity: decreased from baseline Gait velocity interpretation: >2.62 ft/sec, indicative of community ambulatory(2.85 sec/ft) General Gait Details: Demonstrating equal reciprocal gait pattern without increase in pain. Good pace although is decreased from baseline   Stairs Stairs: Yes Stairs assistance:  Supervision Stair Management: One rail Right;Two rails;Forwards;Step to pattern Number of Stairs: 4 General stair comments: Performs well; good understanding of sequence. No physical assist required   Wheelchair Mobility    Modified Rankin (Stroke Patients Only)       Balance                                            Cognition Arousal/Alertness: Awake/alert Behavior During Therapy: WFL for tasks assessed/performed Overall Cognitive Status: Within Functional Limits for tasks assessed                                        Exercises Other Exercises Other Exercises: stand Mod I/distant supervision for attempt to void per nursing request x several minutes    General Comments        Pertinent Vitals/Pain Pain Assessment: No/denies pain    Home Living                      Prior Function            PT Goals (current goals can now be found in the care plan section) Progress towards PT goals: Progressing toward goals    Frequency    BID      PT Plan Current plan remains appropriate    Co-evaluation              AM-PAC PT "6 Clicks" Mobility  Outcome Measure  Help needed turning from your back to your side while in a flat bed without using bedrails?: None Help needed moving from lying on your back to sitting on the side of a flat bed without using bedrails?: None Help needed moving to and from a bed to a chair (including a wheelchair)?: A Little Help needed standing up from a chair using your arms (e.g., wheelchair or bedside chair)?: A Little Help needed to walk in hospital room?: A Little Help needed climbing 3-5 steps with a railing? : A Little 6 Click Score: 20    End of Session   Activity Tolerance: Patient tolerated treatment well Patient left: in chair;with call bell/phone within reach;with chair alarm set;with SCD's reapplied;Other (comment)(polar care in place) Nurse Communication: Other  (comment)(unsuccessful ability to void) PT Visit Diagnosis: Other abnormalities of gait and mobility (R26.89);Muscle weakness (generalized) (M62.81)     Time: 0051-1021 PT Time Calculation (min) (ACUTE ONLY): 24 min  Charges:  $Gait Training: 23-37 mins                      Scot Dock, PTA 05/18/2018, 3:58 PM

## 2018-05-18 NOTE — Progress Notes (Signed)
Pt voided 300 ml, post bladder 450 ml.  MD Hooten notified. Orders received to in and out X1 if bladder scan > 500 ml.

## 2018-05-18 NOTE — Anesthesia Postprocedure Evaluation (Signed)
Anesthesia Post Note  Patient: Frank Stephens  Procedure(s) Performed: RIGHT TOTAL KNEE ARTHROPLASTY (Right Knee)  Patient location during evaluation: Nursing Unit Anesthesia Type: Spinal Level of consciousness: oriented and awake and alert Pain management: pain level controlled Vital Signs Assessment: post-procedure vital signs reviewed and stable Respiratory status: spontaneous breathing and respiratory function stable Cardiovascular status: blood pressure returned to baseline and stable Postop Assessment: no headache, no backache, no apparent nausea or vomiting and patient able to bend at knees Anesthetic complications: no     Last Vitals:  Vitals:   05/18/18 0419 05/18/18 0725  BP: 121/81 132/79  Pulse: 81 84  Resp: 19 17  Temp: 36.5 C (!) 36.4 C  SpO2: 95% 97%    Last Pain:  Vitals:   05/18/18 0725  TempSrc: Oral  PainSc:                  Frank Stephens

## 2018-05-18 NOTE — Evaluation (Signed)
Occupational Therapy Evaluation Patient Details Name: Frank Stephens MRN: [id number] DOB: 05/16/1950 Today's Date: 05/18/2018    History of Present Illness Pt is a 68 yo male s/p elective R TKA. PMH includes HTN.    Clinical Impression   Pt is 68 year old male s/p R TKA.  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 He is married and his wife is able to help as needed.  Pt currently requires min guard assist and cues for LB dressing while in seated position due to limited AROM of R knee.  He did not have any pain sitting in recliner with legs extended and only 1/10 with legs flexed to work on [employer] dressing skills. Pt would benefit from instruction in dressing techniques with or without assistive devices for dressing and bathing skills.  Pt would also benefit from recommendations for home modifications to increase safety in the bathroom and prevent falls. Will assess for OT [facility] needs as pt progresses in therapy but most likely will not need any further OT.      Follow Up Recommendations  No OT follow up    Equipment Recommendations       Recommendations for Other Services       Precautions / Restrictions Precautions Precautions: Knee Required Braces or Orthoses: Other Brace Other Brace: Pt able to do 10 Ind RLE SLRs without extensor lag, no KI required  Restrictions Weight Bearing Restrictions: Yes RLE Weight Bearing: Weight bearing as tolerated      Mobility Bed Mobility                  Transfers                      Balance                                           ADL either performed or assessed with clinical judgement   ADL Overall ADL's : Needs assistance/impaired Eating/Feeding: Independent;Set up   Grooming: Wash/dry hands;Wash/dry face;Oral care;Set up;Independent               Lower Body Dressing: Min guard;Set up;With adaptive equipment;Sit  to/from stand   Toilet Transfer: Set up;Min guard             General ADL Comments: Pt has a BSC for over toilet to use at home.  Rec shower chair with back or use BSc in shower stall and rec shower mat to reduce risk of falling and keep cell phone with him at all times with a bag or basket on walker.     Vision Patient Visual Report: No change from baseline       Perception     Praxis      Pertinent Vitals/Pain Pain Assessment: No/denies pain     Hand Dominance Right   Extremity/Trunk Assessment Upper Extremity Assessment Upper Extremity Assessment: Overall WFL for tasks assessed   Lower Extremity Assessment Lower Extremity Assessment: Defer to PT evaluation   Cervical / Trunk Assessment Cervical / Trunk Assessment: Normal   Communication Communication Communication: No difficulties   Cognition Arousal/Alertness: Awake/alert Behavior During Therapy: WFL for tasks assessed/performed Overall Cognitive Status: Within Functional Limits for tasks assessed  General Comments       Exercises     Shoulder Instructions      Home Living Family/patient expects to be discharged to:: Private residence Living Arrangements: Spouse/significant other Available Help at Discharge: Family;Available 24 hours/day Type of Home: House Home Access: Stairs to enter [employer] of Steps: 5 Entrance Stairs-Rails: Right;Left Home Layout: One level     Bathroom Shower/Tub: Producer, television/film/video: Handicapped height     Home Equipment: Bedside commode;Shower seat;Adaptive equipment Adaptive Equipment: Reacher        Prior Functioning/Environment Level of Independence: Independent        Comments: 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, 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, 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, Ind with ADLs        OT Problem List: Decreased strength;Impaired balance (sitting and/or standing);Decreased  knowledge of use of DME or AE;Decreased activity tolerance      OT Treatment/Interventions: Self-care/ADL training;DME and/or AE instruction;Patient/family education    OT Goals(Current goals can be found in the care plan section) Acute Rehab OT Goals Patient Stated Goal: to regain independence OT Goal Formulation: With patient/family Time For Goal Achievement: 06/01/18 Potential to Achieve Goals: Good ADL Goals Pt Will Perform Lower Body Dressing: with supervision;with adaptive equipment;sit to/from stand Pt Will Transfer to Toilet: with supervision;stand pivot transfer;bedside commode;regular height toilet  OT Frequency: Min 1X/week   Barriers to D/C:            Co-evaluation              AM-PAC OT "6 Clicks" Daily Activity     Outcome Measure Help from another person eating meals?: None Help from another person taking care of personal grooming?: None Help from another person toileting, which includes using toliet, bedpan, or urinal?: A Little Help from another person bathing (including washing, rinsing, drying)?: A Little Help from another person to put on and taking off regular upper body clothing?: None Help from another person to put on and taking off regular lower body clothing?: A Little 6 Click Score: 21   End of Session Equipment Utilized During Treatment: Gait belt  Activity Tolerance: Patient tolerated treatment well Patient left: in chair;with chair alarm set;with family/visitor present  OT Visit Diagnosis: Unsteadiness on feet (R26.81);Muscle weakness (generalized) (M62.81)                Time: 7867-6720 OT Time Calculation (min): 30 min Charges:  OT General Charges $OT Visit: 1 Visit OT Evaluation $OT Eval Low Complexity: 1 Low OT Treatments $Self Care/Home Management : 8-22 mins  Susanne Borders, OTR/L ascom [phone] 05/18/18, 10:58 AM

## 2018-05-18 NOTE — Progress Notes (Signed)
Clinical Social Worker (CSW) received SNF consult. PT is recommending home health. RN case manager aware of above. Please reconsult if future social work needs arise. CSW signing off.   Anneth Brunell, LCSW [phone] 

## 2018-05-18 NOTE — [place] FL2 (Signed)
[place] MEDICAID FL2 LEVEL OF CARE SCREENING TOOL     IDENTIFICATION  Patient Name: Frank Stephens Birthdate: [birth year] Sex: male Admission Date (Current Location): 05/17/2018  [place] and [place] Number:  Chiropodist and Address:  [facility], [address], [place], [place] [address]      Provider Number: [id number]  Attending Physician Name and Address:  Donato Heinz, MD  Relative Name and Phone Number:       Current Level of Care: Hospital Recommended Level of Care: Skilled Nursing Facility Prior Approval Number:    Date Approved/Denied:   PASRR Number: [id number])  Discharge Plan: SNF    Current Diagnoses: Patient Active Problem List   Diagnosis Date Noted  . Benign essential hypertension 05/17/2018  . Pure hypercholesterolemia 05/17/2018  . Total knee replacement status 05/17/2018  . Primary osteoarthritis of both knees 02/21/2018  . Goiter 09/09/2013  . Thyroid nodule 09/09/2013  . GERD (gastroesophageal reflux disease) 07/28/2013  . Sleep apnea, obstructive 07/28/2013    Orientation RESPIRATION BLADDER Height & Weight     Self, Time, Situation, Place  Normal Continent Weight: 205 lb 0.4 oz (93 kg) Height:  5\' 10"  (177.8 cm)  BEHAVIORAL SYMPTOMS/MOOD NEUROLOGICAL BOWEL NUTRITION STATUS      Continent Diet(Diet: Regular )  AMBULATORY STATUS COMMUNICATION OF NEEDS Skin   Extensive Assist Verbally Surgical wounds(Incision: Right Knee. )                       Personal Care Assistance Level of Assistance  Bathing, Feeding, Dressing Bathing Assistance: Limited assistance Feeding assistance: Independent Dressing Assistance: Limited assistance     Functional Limitations Info  Sight, Hearing, Speech Sight Info: Adequate Hearing Info: Adequate Speech Info: Adequate    SPECIAL CARE FACTORS FREQUENCY  PT (By licensed PT), OT (By licensed OT)     PT Frequency: (5) OT Frequency: (5)             Contractures      Additional Factors Info  Code Status, Allergies Code Status Info: (Full Code. ) Allergies Info: (Ace Inhibitors, Other)           Current Medications (05/18/2018):  This is the current hospital active medication list Current Facility-Administered Medications  Medication Dose Route Frequency Provider Last Rate Last Dose  . 0.9 %  sodium chloride infusion   Intravenous Continuous Hooten, Illene Labrador, MD 100 mL/hr at 05/17/18 2240    . acetaminophen (TYLENOL) tablet 325-650 mg  325-650 mg Oral Q6H PRN Hooten, Illene Labrador, MD      . alum & mag hydroxide-simeth (MAALOX/MYLANTA) 200-200-20 MG/5ML suspension 30 mL  30 mL Oral Q4H PRN Hooten, Illene Labrador, MD      . bisacodyl (DULCOLAX) suppository 10 mg  10 mg Rectal Daily PRN Hooten, Illene Labrador, MD      . ceFAZolin (ANCEF) IVPB 2g/100 mL premix  2 g Intravenous Q6H Hooten, Illene Labrador, MD 200 mL/hr at 05/18/18 0928 2 g at 05/18/18 0928  . celecoxib (CELEBREX) capsule 200 mg  200 mg Oral BID Donato Heinz, MD   200 mg at 05/18/18 4742  . diphenhydrAMINE (BENADRYL) 12.5 MG/5ML elixir 12.5-25 mg  12.5-25 mg Oral Q4H PRN Hooten, Illene Labrador, MD      . enoxaparin (LOVENOX) injection 30 mg  30 mg Subcutaneous Q12H Hooten, Illene Labrador, MD   30 mg at 05/18/18 5956  . ferrous sulfate tablet 325 mg  325 mg Oral BID  WC Hooten, Illene Labrador, MD   325 mg at 05/18/18 9507  . fluticasone (FLONASE) 50 MCG/ACT nasal spray 2 spray  2 spray Each Nare Daily PRN Hooten, Illene Labrador, MD      . gabapentin (NEURONTIN) capsule 300 mg  300 mg Oral QHS Hooten, Illene Labrador, MD   300 mg at 05/17/18 2026  . hydrochlorothiazide (HYDRODIURIL) tablet 25 mg  25 mg Oral Daily Hooten, Illene Labrador, MD      . HYDROmorphone (DILAUDID) injection 0.5-1 mg  0.5-1 mg Intravenous Q4H PRN Hooten, Illene Labrador, MD      . losartan (COZAAR) tablet 50 mg  50 mg Oral Daily Hooten, Illene Labrador, MD      . magnesium hydroxide (MILK OF MAGNESIA) suspension 30 mL  30 mL Oral Daily Hooten, Illene Labrador, MD   30 mL at 05/18/18 2257   . menthol-cetylpyridinium (CEPACOL) lozenge 3 mg  1 lozenge Oral PRN Hooten, Illene Labrador, MD       Or  . phenol (CHLORASEPTIC) mouth spray 1 spray  1 spray Mouth/Throat PRN Hooten, Illene Labrador, MD      . metoCLOPramide (REGLAN) tablet 5-10 mg  5-10 mg Oral Q8H PRN Hooten, Illene Labrador, MD       Or  . metoCLOPramide (REGLAN) injection 5-10 mg  5-10 mg Intravenous Q8H PRN Hooten, Illene Labrador, MD      . metoCLOPramide (REGLAN) tablet 10 mg  10 mg Oral TID AC & HS Hooten, Illene Labrador, MD   10 mg at 05/18/18 5051  . multivitamin with minerals tablet 1 tablet  1 tablet Oral Daily Hooten, Illene Labrador, MD   1 tablet at 05/18/18 [phone]  . ondansetron (ZOFRAN) tablet 4 mg  4 mg Oral Q6H PRN Hooten, Illene Labrador, MD       Or  . ondansetron (ZOFRAN) injection 4 mg  4 mg Intravenous Q6H PRN Hooten, Illene Labrador, MD      . oxyCODONE (Oxy IR/ROXICODONE) immediate release tablet 10 mg  10 mg Oral Q4H PRN Hooten, Illene Labrador, MD      . oxyCODONE (Oxy IR/ROXICODONE) immediate release tablet 5 mg  5 mg Oral Q4H PRN Donato Heinz, MD   5 mg at 05/17/18 2238  . pantoprazole (PROTONIX) EC tablet 40 mg  40 mg Oral BID Donato Heinz, MD   40 mg at 05/18/18 8251  . senna-docusate (Senokot-S) tablet 1 tablet  1 tablet Oral BID Donato Heinz, MD   1 tablet at 05/18/18 [phone]  . sodium phosphate (FLEET) 7-19 GM/118ML enema 1 enema  1 enema Rectal Once PRN Hooten, Illene Labrador, MD      . traMADol Janean Sark) tablet 50-100 mg  50-100 mg Oral Q4H PRN Donato Heinz, MD   100 mg at 05/18/18 2103     Discharge Medications: Please see discharge summary for a list of discharge medications.  Relevant Imaging Results:  Relevant Lab Results:   Additional Information (SSN: [id number])  Tieshia Rettinger, Darleen Crocker, LCSW

## 2018-05-18 NOTE — Progress Notes (Signed)
   Subjective: 1 Day Post-Op Procedure(s) (LRB): RIGHT TOTAL KNEE ARTHROPLASTY (Right) Patient reports pain as mild.   Patient is well, and has had no acute complaints or problems Patient did well with therapy yesterday.  Was able to ambulate 10 feet.  Range of motion 0-72. Plan is to go Home after hospital stay. no nausea and no vomiting Patient denies any chest pains or shortness of breath. Slept off and on during the night.  Objective: Vital signs in last 24 hours: Temp:  [97 F (36.1 C)-97.7 F (36.5 C)] 97.5 F (36.4 C) (02/25 0725) Pulse Rate:  [81-113] 84 (02/25 0725) Resp:  [15-21] 17 (02/25 0725) BP: (92-155)/(44-93) 132/79 (02/25 0725) SpO2:  [94 %-100 %] 97 % (02/25 0725) Weight:  [93 kg] 93 kg (02/24 1308) Heels are non tender and elevated off the bed using rolled towels as well as bone foam under her operative heel  Intake/Output from previous day: 02/24 0701 - 02/25 0700 In: 2768.3 [P.O.:960; I.V.:1608.3; IV Piggyback:200] Out: 2320 [Urine:1850; Drains:420; Blood:50] Intake/Output this shift: No intake/output data recorded.  No results for input(s): HGB in the last 72 hours. No results for input(s): WBC, RBC, HCT, PLT in the last 72 hours. No results for input(s): NA, K, CL, CO2, BUN, CREATININE, GLUCOSE, CALCIUM in the last 72 hours. No results for input(s): LABPT, INR in the last 72 hours.  EXAM General - Patient is Alert, Appropriate and Oriented Extremity - Neurologically intact Neurovascular intact Sensation intact distally Intact pulses distally Dorsiflexion/Plantar flexion intact Compartment soft Dressing - dressing C/D/I Motor Function - intact, moving foot and toes well on exam.  Able to do straight leg raise on his own  Past Medical History:  Diagnosis Date  . Acid reflux   . Hypertension   . PONV (postoperative nausea and vomiting)   . Sleep apnea 2013    Assessment/Plan: 1 Day Post-Op Procedure(s) (LRB): RIGHT TOTAL KNEE ARTHROPLASTY  (Right) Active Problems:   Total knee replacement status  Estimated body mass index is 29.42 kg/m as calculated from the following:   Height as of this encounter: 5\' 10"  (1.778 m).   Weight as of this encounter: 93 kg. Advance diet Up with therapy D/C IV fluids Plan for discharge tomorrow Discharge home with home health  Labs: None DVT Prophylaxis - Lovenox, Foot Pumps and TED hose Weight-Bearing as tolerated to right leg D/C O2 and Pulse OX and try on Room Air Begin working on bowel movement  Lessly Stigler R. [facility] PA [facility] 05/18/2018, 7:33 AM

## 2018-05-18 NOTE — Care Management Note (Signed)
Case Management Note  Patient Details  Name: Frank Stephens MRN: [id number] Date of Birth: 04/01/1950  Subjective/Objective:                  Met with the patient to discuss DC plan and needs Patient is up with PT walking to the bathroom and doing well. Left HH list at the bedside, will come back to discuss.    Action/Plan: [place] list provided to the patient per CMS.gov   Expected Discharge Date:                  Expected Discharge Plan:     In-House Referral:     Discharge planning Services  CM Consult  Post Acute Care Choice:  Home Health Choice offered to:  Patient  DME Arranged:    DME Agency:     HH Arranged:  PT HH Agency:     Status of Service:  In process, will continue to follow  If discussed at Long Length of Stay Meetings, dates discussed:    Additional Comments:  Su Hilt, RN 05/18/2018, 3:38 PM

## 2018-05-18 NOTE — Progress Notes (Signed)
Physical Therapy Treatment Patient Details Name: Frank Stephens MRN: [id number] DOB: 06/14/1950 Today's Date: 05/18/2018    History of Present Illness Pt is a 68 yo male s/p elective R TKA. PMH includes HTN.     PT Comments    Pt presents with deficits in strength, transfers, mobility, gait, R knee ROM, and activity tolerance but is making very good progress towards goals.  Pt was SBA with transfers this session with good eccentric and concentric control and was able to amb 100' with a RW and step-through gait pattern.  Pt reported no adverse symptoms during the session other than very mild R knee pain with movement that resolved at rest.  Pt will benefit from HHPT services upon discharge to safely address above deficits for decreased caregiver assistance and eventual return to PLOF.     Follow Up Recommendations  Home health PT     Equipment Recommendations  Rolling walker with 5" wheels    Recommendations for Other Services       Precautions / Restrictions Precautions Precautions: Knee Required Braces or Orthoses: Other Brace Other Brace: Pt able to do 10 Ind RLE SLRs without extensor lag, no KI required  Restrictions Weight Bearing Restrictions: Yes RLE Weight Bearing: Weight bearing as tolerated    Mobility  Bed Mobility               General bed mobility comments: NT, pt in recliner  Transfers Overall transfer level: Needs assistance Equipment used: Rolling walker (2 wheeled) Transfers: Sit to/from Stand Sit to Stand: Supervision         General transfer comment: Min verbal cues for sequencing with no adverse symptoms transitioning from sit to stand.  Ambulation/Gait Ambulation/Gait assistance: Supervision;Min guard Gait Distance (Feet): 100 Feet Assistive device: Rolling walker (2 wheeled) Gait Pattern/deviations: Step-through pattern;Decreased step length - right;Decreased step length - left Gait velocity: decreased   General Gait Details:  Good stability during amb without adverse symptoms other than mild R knee pain.     Stairs             Wheelchair Mobility    Modified Rankin (Stroke Patients Only)       Balance Overall balance assessment: No apparent balance deficits (not formally assessed)                                          Cognition Arousal/Alertness: Awake/alert Behavior During Therapy: WFL for tasks assessed/performed Overall Cognitive Status: Within Functional Limits for tasks assessed                                        Exercises Total Joint Exercises Ankle Circles/Pumps: Strengthening;Both;10 reps Quad Sets: Strengthening;AROM;Both;10 reps Gluteal Sets: Strengthening;Both;10 reps Long Arc Quad: AROM;Strengthening;Right;10 reps;15 reps Knee Flexion: AROM;Strengthening;Right;10 reps;15 reps Goniometric ROM: R knee AROM: 0-81 deg Marching in Standing: AROM;Both;10 reps;Standing Other Exercises Other Exercises: 90 deg right turn training to prevent CKC twisting on the R knee Other Exercises: HEP education/review provided per handout    General Comments        Pertinent Vitals/Pain Pain Assessment: No/denies pain    Home Living Family/patient expects to be discharged to:: Private residence Living Arrangements: Spouse/significant other Available Help at Discharge: Family;Available 24 hours/day Type of Home: House Home Access: Stairs to enter  Entrance Stairs-Rails: Right;Left Home Layout: One level Home Equipment: Bedside commode;Shower seat;Adaptive equipment      Prior Function Level of Independence: Independent      Comments: 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, 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, 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, Ind with ADLs   PT Goals (current goals can now be found in the care plan section) Acute Rehab PT Goals Patient Stated Goal: to regain independence Progress towards PT goals: Progressing toward goals    Frequency     BID      PT Plan Current plan remains appropriate    Co-evaluation              AM-PAC PT "6 Clicks" Mobility   Outcome Measure  Help needed turning from your back to your side while in a flat bed without using bedrails?: A Little Help needed moving from lying on your back to sitting on the side of a flat bed without using bedrails?: A Little Help needed moving to and from a bed to a chair (including a wheelchair)?: A Little Help needed standing up from a chair using your arms (e.g., wheelchair or bedside chair)?: A Little Help needed to walk in hospital room?: A Little Help needed climbing 3-5 steps with a railing? : A Little 6 Click Score: 18    End of Session Equipment Utilized During Treatment: Gait belt Activity Tolerance: Patient tolerated treatment well Patient left: in chair;with call bell/phone within reach;with chair alarm set;with SCD's reapplied;Other (comment);with family/visitor present(Polar care to R knee) Nurse Communication: Mobility status PT Visit Diagnosis: Other abnormalities of gait and mobility (R26.89);Muscle weakness (generalized) (M62.81)     Time: 1025-8527 PT Time Calculation (min) (ACUTE ONLY): 25 min  Charges:  $Gait Training: 8-22 mins $Therapeutic Exercise: 8-22 mins                     D. Scott Ella Golomb PT, DPT 05/18/18, 11:23 AM

## 2018-05-19 NOTE — Progress Notes (Signed)
   Subjective: 2 Days Post-Op Procedure(s) (LRB): RIGHT TOTAL KNEE ARTHROPLASTY (Right) Patient reports pain as 0 on 0-10 scale.   Patient is well, and has had no acute complaints or problems Patient did extremely well with therapy yesterday.  Was able to ambulate 370 feet as well as do steps. Plan is to go Home after hospital stay. no nausea and no vomiting Patient denies any chest pains or shortness of breath. Objective: Vital signs in last 24 hours: Temp:  [97.6 F (36.4 C)-98 F (36.7 C)] 98 F (36.7 C) (02/25 2341) Pulse Rate:  [81-90] 90 (02/25 2341) Resp:  [16-20] 20 (02/25 2341) BP: (118-131)/(73-81) 131/81 (02/25 2341) SpO2:  [95 %-96 %] 95 % (02/25 2341) well approximated incision Heels are non tender and elevated off the bed using rolled towels Intake/Output from previous day: 02/25 0701 - 02/26 0700 In: 840 [P.O.:840] Out: 1951 [Urine:1700; Drains:250; Stool:1] Intake/Output this shift: No intake/output data recorded.  No results for input(s): HGB in the last 72 hours. No results for input(s): WBC, RBC, HCT, PLT in the last 72 hours. No results for input(s): NA, K, CL, CO2, BUN, CREATININE, GLUCOSE, CALCIUM in the last 72 hours. No results for input(s): LABPT, INR in the last 72 hours.  EXAM General - Patient is Alert, Appropriate and Oriented Extremity - Neurologically intact Neurovascular intact Sensation intact distally Intact pulses distally Dorsiflexion/Plantar flexion intact No cellulitis present Compartment soft Dressing - dressing C/D/I Motor Function - intact, moving foot and toes well on exam.    Past Medical History:  Diagnosis Date  . Acid reflux   . Hypertension   . PONV (postoperative nausea and vomiting)   . Sleep apnea 2013    Assessment/Plan: 2 Days Post-Op Procedure(s) (LRB): RIGHT TOTAL KNEE ARTHROPLASTY (Right) Active Problems:   Total knee replacement status  Estimated body mass index is 29.42 kg/m as calculated from the  following:   Height as of this encounter: 5\' 10"  (1.778 m).   Weight as of this encounter: 93 kg. Up with therapy Discharge home with home health  Labs: None DVT Prophylaxis - Lovenox, Foot Pumps and TED hose Weight-Bearing as tolerated to right leg Please wash operative leg, change dressing and apply TED stockings to both legs Please give the patient 2 extra honeycomb dressings to take home Be sure bone foam goes home with patient  Lynnda Shields. [facility] PA [facility] 05/19/2018, 7:48 AM

## 2018-05-19 NOTE — Progress Notes (Signed)
Physical Therapy Treatment Patient Details Name: Frank Stephens MRN: [id number] DOB: 11-30-1950 Today's Date: 05/19/2018    History of Present Illness Pt is a 68 yo male s/p elective R TKA. PMH includes HTN.     PT Comments    Pt agreeable to PT; reports 1/10 pain in R knee. Continues to perform all mobility very well at a supervision to Mod I level. Range of R knee is excellent at 0-116 degrees and demonstrates fairly fluid movement between R knee flexion and extension. Educated pt/spouse regarding continuation of stretching exercises regularly each day for continued progress and to avoid any regression. Educated regarding use of ice and sport specific type therapy when pt receives outpatient PT (pt wishes to eventually return to golf). Pt to discharge home with HHPT.    Follow Up Recommendations  Home health PT;Other (comment)(to ensure safety with function)     Equipment Recommendations       Recommendations for Other Services       Precautions / Restrictions Precautions Precautions: Knee Restrictions Weight Bearing Restrictions: Yes RLE Weight Bearing: Weight bearing as tolerated    Mobility  Bed Mobility               General bed mobility comments: Not tested; up in the chair  Transfers Overall transfer level: Modified independent Equipment used: Rolling walker (2 wheeled) Transfers: Sit to/from Stand Sit to Stand: Modified independent (Device/Increase time)         General transfer comment: Improved use of RLE from multiple surfaces  Ambulation/Gait Ambulation/Gait assistance: Modified independent (Device/Increase time);Supervision Gait Distance (Feet): 270 Feet(also to/from bathroom) Assistive device: Rolling walker (2 wheeled) Gait Pattern/deviations: Step-through pattern Gait velocity: decreased from baseline Gait velocity interpretation: >2.62 ft/sec, indicative of community ambulatory General Gait Details: Continues with great reciprocal  pattern with good stride. Supervision with chair approach to ensure safety with turns and backward ambulation to chair   Stairs             Wheelchair Mobility    Modified Rankin (Stroke Patients Only)       Balance Overall balance assessment: Modified Independent                                          Cognition Arousal/Alertness: Awake/alert Behavior During Therapy: WFL for tasks assessed/performed Overall Cognitive Status: Within Functional Limits for tasks assessed                                        Exercises Total Joint Exercises Quad Sets: Strengthening;Right;20 reps;Seated(with gravity assisted stretch) Long Arc Quad: AROM;Right;20 reps;Seated Knee Flexion: AROM;Right;20 reps;Seated Goniometric ROM: 0-116 Other Exercises Other Exercises: Educated pt and spouse in stretching tech for flex and ext for home including freqeuncy, hold duration and overall duration.  Other Exercises: Pt notes wishing to return to golf eventually (needs other knee replaced as well). Discussed/educated on discussing with outpt therapy for sport specific exercises/training    General Comments        Pertinent Vitals/Pain Pain Assessment: 0-10 Pain Score: 1  Pain Location: R knee Pain Descriptors / Indicators: Dull Pain Intervention(s): Monitored during session    Home Living                      Prior  Function            PT Goals (current goals can now be found in the care plan section) Progress towards PT goals: Progressing toward goals    Frequency    BID      PT Plan Current plan remains appropriate    Co-evaluation              AM-PAC PT "6 Clicks" Mobility   Outcome Measure  Help needed turning from your back to your side while in a flat bed without using bedrails?: None Help needed moving from lying on your back to sitting on the side of a flat bed without using bedrails?: None Help needed moving to  and from a bed to a chair (including a wheelchair)?: A Little Help needed standing up from a chair using your arms (e.g., wheelchair or bedside chair)?: A Little Help needed to walk in hospital room?: A Little Help needed climbing 3-5 steps with a railing? : A Little 6 Click Score: 20    End of Session   Activity Tolerance: Patient tolerated treatment well Patient left: in chair;with call bell/phone within reach;with chair alarm set;with family/visitor present;Other (comment)(polar care in place)   PT Visit Diagnosis: Other abnormalities of gait and mobility (R26.89);Muscle weakness (generalized) (M62.81)     Time: 1000-1030 PT Time Calculation (min) (ACUTE ONLY): 30 min  Charges:  $Gait Training: 8-22 mins $Therapeutic Exercise: 8-22 mins                      Scot Dock, PTA 05/19/2018, 10:36 AM

## 2018-05-19 NOTE — Progress Notes (Signed)
Discharge summary reviewed with verbal understanding. Leg cleaned and dressing changed per order, thigh high placed. Escorted to personal vehicle.

## 2018-05-19 NOTE — Care Management (Signed)
The patient was notified of [facility] unable to accept, he has chosen [facility], Data processing manager thru secure email

## 2018-05-19 NOTE — Care Management Note (Signed)
Case Management Note  Patient Details  Name: Frank Stephens MRN: [id number] Date of Birth: 11-Nov-1950  Subjective/Objective:                  Met with the patient to discuss DC plan and needs, He has a [facility]side [place] but needs a RW, notified Corene Cornea at [facility] of the need.  [place] list reviewed, He would like to use [facility], Notified Jason thru [employer]. This patient does have transportation with his wife.  [facility] [place]   Action/Plan: [facility] list reviewed per CMS.gov, patient chooses [facility], Notified jason at [facility] of the choice and that the patient needs a RW. Expected Discharge Date:  05/19/18               Expected Discharge Plan:     In-House Referral:     Discharge planning Services  CM Consult  Post Acute Care Choice:  Home Health Choice offered to:  Patient  DME Arranged:  [facility] youth DME Agency:     HH Arranged:  PT [place]:  [place]  Status of Service:  In process, will continue to follow  If discussed at Long Length of Stay Meetings, dates discussed:    Additional Comments:  Su Hilt, RN 05/19/2018, 8:22 AM

## 2018-05-19 NOTE — Care Management (Signed)
Rosey Bath with [facility] has emailed back and accepted the patient

## 2018-05-19 NOTE — Care Management (Signed)
Called [facility] at [phone] to give verbal RX for Lovenox 40 mg X 14 once daily Cost is $9.04, notified the patient of the cost

## 2018-05-19 NOTE — Care Management (Signed)
[facility] is unable to accept the patient.  Notify patient and get second choice

## 2018-09-06 ENCOUNTER — Other Ambulatory Visit: Payer: 59

## 2018-09-09 ENCOUNTER — Other Ambulatory Visit: Payer: 59

## 2018-10-08 ENCOUNTER — Encounter
Admission: RE | Admit: 2018-10-08 | Discharge: 2018-10-08 | Disposition: A | Discharge status: Home / Self Care | Payer: 59 | Source: Ambulatory Visit | Attending: Orthopedic Surgery | Admitting: Orthopedic Surgery

## 2018-10-08 ENCOUNTER — Other Ambulatory Visit: Payer: Self-pay

## 2018-10-08 DIAGNOSIS — Z01812 Encounter for preprocedural laboratory examination: Secondary | ICD-10-CM | POA: Insufficient documentation

## 2018-10-08 DIAGNOSIS — Z1159 Encounter for screening for other viral diseases: Secondary | ICD-10-CM | POA: Insufficient documentation

## 2018-10-08 HISTORY — DX: Cardiac arrhythmia, unspecified: I49.9

## 2018-10-08 HISTORY — DX: Unspecified osteoarthritis, unspecified site: M19.90

## 2018-10-08 HISTORY — DX: Nontoxic single thyroid nodule: E04.1

## 2018-10-08 LAB — COMPREHENSIVE METABOLIC PANEL
ALT: 13 U/L (ref 0–44)
AST: 13 U/L — ABNORMAL LOW (ref 15–41)
Albumin: 4.4 g/dL (ref 3.5–5.0)
Alkaline Phosphatase: 51 U/L (ref 38–126)
Anion gap: 9 (ref 5–15)
BUN: 18 mg/dL (ref 8–23)
CO2: 27 mmol/L (ref 22–32)
Calcium: 9.2 mg/dL (ref 8.9–10.3)
Chloride: 103 mmol/L (ref 98–111)
Creatinine, Ser: 1 mg/dL (ref 0.61–1.24)
GFR calc Af Amer: >60 mL/min (ref >60)
GFR calc non Af Amer: >60 mL/min (ref >60)
Glucose, Bld: 97 mg/dL (ref 70–99)
Potassium: 3.7 mmol/L (ref 3.5–5.1)
Sodium: 139 mmol/L (ref 135–145)
Total Bilirubin: 1 mg/dL (ref 0.3–1.2)
Total Protein: 7.4 g/dL (ref 6.5–8.1)

## 2018-10-08 LAB — URINALYSIS, ROUTINE W REFLEX MICROSCOPIC
Bilirubin Urine: NEGATIVE
Glucose, UA: NEGATIVE mg/dL
Hgb urine dipstick: NEGATIVE
Ketones, ur: NEGATIVE mg/dL
Leukocytes,Ua: NEGATIVE
Nitrite: NEGATIVE
Protein, ur: NEGATIVE mg/dL
Specific Gravity, Urine: 1.009 (ref 1.005–1.030)
pH: 8 (ref 5.0–8.0)

## 2018-10-08 LAB — CBC
HCT: 43.7 % (ref 39.0–52.0)
Hemoglobin: 14.2 g/dL (ref 13.0–17.0)
MCH: 28.2 pg (ref 26.0–34.0)
MCHC: 32.5 g/dL (ref 30.0–36.0)
MCV: 86.7 fL (ref 80.0–100.0)
Platelets: 215 10*3/uL (ref 150–400)
RBC: 5.04 MIL/uL (ref 4.22–5.81)
RDW: 14 % (ref 11.5–15.5)
WBC: 4.7 10*3/uL (ref 4.0–10.5)
nRBC: 0 % (ref 0.0–0.2)

## 2018-10-08 LAB — SURGICAL PCR SCREEN
MRSA, PCR: NEGATIVE
Staphylococcus aureus: NEGATIVE

## 2018-10-08 LAB — TYPE AND SCREEN
ABO/RH(D): A POS
Antibody Screen: NEGATIVE

## 2018-10-08 LAB — SEDIMENTATION RATE: Sed Rate: 5 mm/hr (ref 0–20)

## 2018-10-08 LAB — SARS CORONAVIRUS 2 (TAT 6-24 HRS): SARS Coronavirus 2: NEGATIVE

## 2018-10-08 LAB — PROTIME-INR
INR: 1.1 (ref 0.8–1.2)
Prothrombin Time: 13.6 seconds (ref 11.4–15.2)

## 2018-10-08 LAB — APTT: aPTT: 33 seconds (ref 24–36)

## 2018-10-08 LAB — C-REACTIVE PROTEIN: CRP: <0.8 mg/dL (ref <1.0)

## 2018-10-08 NOTE — Patient Instructions (Signed)
Your procedure is scheduled on: 10-13-18 [facility] Report to Same Day Surgery 2nd floor medical mall [facility] Entrance-take elevator on left to 2nd floor.  Check in with surgery information desk.) To find out your arrival time please call [phone] between 1PM - 3PM on 10-12-18 TUESDAY  Remember: Instructions that are not followed completely may result in serious medical risk, up to and including death, or upon the discretion of your surgeon and anesthesiologist your surgery may need to be rescheduled.    _x___ 1. Do not eat food after midnight the night before your procedure. NO GUM OR CANDY AFTER MIDNIGHT. You may drink clear liquids up to 2 hours before you are scheduled to arrive at the hospital for your procedure.  Do not drink clear liquids within 2 hours of your scheduled arrival to the hospital.  Clear liquids include  --Water or Apple juice without pulp  --Clear carbohydrate beverage such as ClearFast or Gatorade  --Black Coffee or Clear Tea (No milk, no creamers, do not add anything to the coffee or Tea   ____Ensure clear carbohydrate drink on the way to the hospital for bariatric patients  _X___Ensure clear carbohydrate drink 3 hours before surgery      __x__ 2. No Alcohol for 24 hours before or after surgery.   __x__3. No Smoking or e-cigarettes for 24 prior to surgery.  Do not use any chewable tobacco products for at least 6 hour prior to surgery   ____  4. Bring all medications with you on the day of surgery if instructed.    __x__ 5. Notify your doctor if there is any change in your medical condition     (cold, fever, infections).    x___6. On the morning of surgery brush your teeth with toothpaste and water.  You may rinse your mouth with mouth wash if you wish.  Do not swallow any toothpaste or mouthwash.   Do not wear jewelry, make-up, hairpins, clips or nail polish.  Do not wear lotions, powders, or perfumes. You may wear deodorant.  Do not shave 48 hours  prior to surgery. Men may shave face and neck.  Do not bring valuables to the hospital.    [facility] is not responsible for any belongings or valuables.               Contacts, dentures or bridgework may not be worn into surgery.  Leave your suitcase in the car. After surgery it may be brought to your room.  For patients admitted to the hospital, discharge time is determined by your treatment team.  _  Patients discharged the day of surgery will not be allowed to drive home.  You will need someone to drive you home and stay with you the night of your procedure.    Please read over the following fact sheets that you were given:   [facility] Preparing for Surgery and or MRSA Information   _x___ TAKE THE FOLLOWING MEDICATION THE MORNING OF SURGERY WITH A SMALL SIP OF WATER. These include:  1. PROTONIX (PANTOPRAZOLE)  2.TAKE AN EXTRA PROTONIX AT BEDTIME THE NIGHT BEFORE YOUR SURGERY  3.  4.  5.  6.  ____Fleets enema or Magnesium Citrate as directed.   _x___ Use CHG Soap or sage wipes as directed on instruction sheet   ____ Use inhalers on the day of surgery and bring to hospital day of surgery  ____ Stop Metformin and Janumet 2 days prior to surgery.    ____ Take  1/2 of usual insulin dose the night before surgery and none on the morning surgery.   _x___ Follow recommendations from Cardiologist, Pulmonologist or PCP regarding stopping Aspirin, Coumadin, Plavix ,Eliquis, Effient, or Pradaxa, and Pletal-LAST DOSE OF ASPIRIN WAS 10-06-18   X____Stop Anti-inflammatories such as Advil, Aleve, Ibuprofen, Motrin, Naproxen, Naprosyn, Goodies powders or aspirin products NOW-OK to take Tylenol   ____ Stop supplements until after surgery.     _X___ Bring C-Pap to the hospital.    GO TO [facility] [address] THRU TODAY FOR YOUR COVID SWAB

## 2018-10-09 LAB — URINE CULTURE
Culture: NO GROWTH
Special Requests: NORMAL

## 2018-10-11 LAB — LATEX, IGE: Latex: <0.1 kU/L

## 2018-10-12 MED ORDER — CEFAZOLIN SODIUM-DEXTROSE 2-4 GM/100ML-% IV SOLN
2.0000 g | INTRAVENOUS | Status: AC
  Administered 2018-10-13: 2 g via INTRAVENOUS

## 2018-10-12 MED ORDER — TRANEXAMIC ACID-NACL 1000-0.7 MG/100ML-% IV SOLN
1000.0000 mg | INTRAVENOUS | Status: AC
  Administered 2018-10-13: 12:00:00 1000 mg via INTRAVENOUS
  Filled 2018-10-12: qty 100

## 2018-10-13 ENCOUNTER — Other Ambulatory Visit: Payer: Self-pay

## 2018-10-13 ENCOUNTER — Inpatient Hospital Stay: Payer: Medicare Other | Admitting: Anesthesiology

## 2018-10-13 ENCOUNTER — Encounter
Admission: RE | Disposition: A | Discharge status: Home Health | Payer: Self-pay | Source: Ambulatory Visit | Attending: Orthopedic Surgery

## 2018-10-13 ENCOUNTER — Inpatient Hospital Stay: Payer: Medicare Other

## 2018-10-13 ENCOUNTER — Inpatient Hospital Stay
Admission: RE | Admit: 2018-10-13 | Discharge: 2018-10-15 | DRG: 470 | Disposition: A | Discharge status: Home Health | Payer: Medicare Other | Source: Ambulatory Visit | Attending: Orthopedic Surgery | Admitting: Orthopedic Surgery

## 2018-10-13 ENCOUNTER — Encounter: Payer: Self-pay | Admitting: Orthopedic Surgery

## 2018-10-13 DIAGNOSIS — Z8249 Family history of ischemic heart disease and other diseases of the circulatory system: Secondary | ICD-10-CM | POA: Diagnosis not present

## 2018-10-13 DIAGNOSIS — G4733 Obstructive sleep apnea (adult) (pediatric): Secondary | ICD-10-CM | POA: Diagnosis present

## 2018-10-13 DIAGNOSIS — M25562 Pain in left knee: Secondary | ICD-10-CM | POA: Diagnosis present

## 2018-10-13 DIAGNOSIS — Z79891 Long term (current) use of opiate analgesic: Secondary | ICD-10-CM

## 2018-10-13 DIAGNOSIS — M1712 Unilateral primary osteoarthritis, left knee: Secondary | ICD-10-CM | POA: Diagnosis present

## 2018-10-13 DIAGNOSIS — Z91040 Latex allergy status: Secondary | ICD-10-CM | POA: Diagnosis not present

## 2018-10-13 DIAGNOSIS — Z79899 Other long term (current) drug therapy: Secondary | ICD-10-CM | POA: Diagnosis not present

## 2018-10-13 DIAGNOSIS — K219 Gastro-esophageal reflux disease without esophagitis: Secondary | ICD-10-CM | POA: Diagnosis present

## 2018-10-13 DIAGNOSIS — J302 Other seasonal allergic rhinitis: Secondary | ICD-10-CM | POA: Diagnosis present

## 2018-10-13 DIAGNOSIS — I10 Essential (primary) hypertension: Secondary | ICD-10-CM | POA: Diagnosis present

## 2018-10-13 DIAGNOSIS — Z888 Allergy status to other drugs, medicaments and biological substances status: Secondary | ICD-10-CM

## 2018-10-13 DIAGNOSIS — Z96659 Presence of unspecified artificial knee joint: Secondary | ICD-10-CM

## 2018-10-13 DIAGNOSIS — Z7982 Long term (current) use of aspirin: Secondary | ICD-10-CM

## 2018-10-13 DIAGNOSIS — Z96651 Presence of right artificial knee joint: Secondary | ICD-10-CM | POA: Diagnosis present

## 2018-10-13 HISTORY — PX: KNEE ARTHROPLASTY: SHX992

## 2018-10-13 SURGERY — ARTHROPLASTY, KNEE, TOTAL, USING IMAGELESS COMPUTER-ASSISTED NAVIGATION
Anesthesia: Spinal | Site: Knee | Laterality: Left

## 2018-10-13 MED ORDER — ADULT MULTIVITAMIN W/MINERALS CH
1.0000 | ORAL_TABLET | Freq: Every day | ORAL | Status: DC
  Administered 2018-10-14: 1 via ORAL
  Filled 2018-10-13: qty 1

## 2018-10-13 MED ORDER — DEXAMETHASONE SODIUM PHOSPHATE 10 MG/ML IJ SOLN
INTRAMUSCULAR | Status: AC
  Filled 2018-10-13: qty 1

## 2018-10-13 MED ORDER — ALUM & MAG HYDROXIDE-SIMETH 200-200-20 MG/5ML PO SUSP: 30.0000 mL | ORAL | Status: DC | PRN

## 2018-10-13 MED ORDER — ONDANSETRON HCL 4 MG/2ML IJ SOLN
INTRAMUSCULAR | Status: AC
  Filled 2018-10-13: qty 2

## 2018-10-13 MED ORDER — HYDROCHLOROTHIAZIDE 25 MG PO TABS
25.0000 mg | ORAL_TABLET | ORAL | Status: DC
  Administered 2018-10-14 – 2018-10-15 (×2): 25 mg via ORAL
  Filled 2018-10-13 (×2): qty 1

## 2018-10-13 MED ORDER — CEFAZOLIN SODIUM-DEXTROSE 2-4 GM/100ML-% IV SOLN
INTRAVENOUS | Status: AC
  Filled 2018-10-13: qty 100

## 2018-10-13 MED ORDER — TRAMADOL HCL 50 MG PO TABS
50.0000 mg | ORAL_TABLET | ORAL | Status: DC | PRN
  Administered 2018-10-13: 50 mg via ORAL
  Administered 2018-10-14: 100 mg via ORAL
  Administered 2018-10-15: 50 mg via ORAL
  Filled 2018-10-13: qty 2
  Filled 2018-10-13: qty 1
  Filled 2018-10-13: qty 2
  Filled 2018-10-13: qty 1

## 2018-10-13 MED ORDER — BUPIVACAINE HCL (PF) 0.5 % IJ SOLN
INTRAMUSCULAR | Status: AC
  Filled 2018-10-13: qty 10

## 2018-10-13 MED ORDER — GABAPENTIN 300 MG PO CAPS
ORAL_CAPSULE | ORAL | Status: AC
  Filled 2018-10-13: qty 1

## 2018-10-13 MED ORDER — LIDOCAINE HCL (PF) 2 % IJ SOLN
INTRAMUSCULAR | Status: AC
  Filled 2018-10-13: qty 20

## 2018-10-13 MED ORDER — CELECOXIB 200 MG PO CAPS
ORAL_CAPSULE | ORAL | Status: AC
  Filled 2018-10-13: qty 2

## 2018-10-13 MED ORDER — CHLORHEXIDINE GLUCONATE 4 % EX LIQD
60.0000 mL | Freq: Once | CUTANEOUS | Status: AC
  Administered 2018-10-13: 4 via TOPICAL

## 2018-10-13 MED ORDER — ACETAMINOPHEN 10 MG/ML IV SOLN
1000.0000 mg | Freq: Four times a day (QID) | INTRAVENOUS | Status: AC
  Administered 2018-10-13 – 2018-10-14 (×4): 1000 mg via INTRAVENOUS
  Filled 2018-10-13 (×4): qty 100

## 2018-10-13 MED ORDER — MAGNESIUM HYDROXIDE 400 MG/5ML PO SUSP
30.0000 mL | Freq: Every day | ORAL | Status: DC
  Administered 2018-10-14 – 2018-10-15 (×2): 30 mL via ORAL
  Filled 2018-10-13 (×2): qty 30

## 2018-10-13 MED ORDER — DEXAMETHASONE SODIUM PHOSPHATE 10 MG/ML IJ SOLN
8.0000 mg | Freq: Once | INTRAMUSCULAR | Status: AC
  Administered 2018-10-13: 8 mg via INTRAVENOUS

## 2018-10-13 MED ORDER — OXYCODONE HCL 5 MG PO TABS
5.0000 mg | ORAL_TABLET | ORAL | Status: DC | PRN
  Administered 2018-10-14 (×2): 5 mg via ORAL
  Filled 2018-10-13 (×2): qty 1

## 2018-10-13 MED ORDER — ACETAMINOPHEN 10 MG/ML IV SOLN
INTRAVENOUS | Status: AC
  Filled 2018-10-13: qty 100

## 2018-10-13 MED ORDER — ENOXAPARIN SODIUM 30 MG/0.3ML ~~LOC~~ SOLN
30.0000 mg | Freq: Two times a day (BID) | SUBCUTANEOUS | Status: DC
  Administered 2018-10-14 – 2018-10-15 (×3): 30 mg via SUBCUTANEOUS
  Filled 2018-10-13 (×3): qty 0.3

## 2018-10-13 MED ORDER — PANTOPRAZOLE SODIUM 40 MG PO TBEC
40.0000 mg | DELAYED_RELEASE_TABLET | ORAL | Status: DC
  Administered 2018-10-14 – 2018-10-15 (×2): 40 mg via ORAL
  Filled 2018-10-13 (×2): qty 1

## 2018-10-13 MED ORDER — ONDANSETRON HCL 4 MG PO TABS: 4.0000 mg | ORAL_TABLET | Freq: Four times a day (QID) | ORAL | Status: DC | PRN

## 2018-10-13 MED ORDER — PROPOFOL 500 MG/50ML IV EMUL
INTRAVENOUS | Status: AC
  Filled 2018-10-13: qty 50

## 2018-10-13 MED ORDER — SODIUM CHLORIDE 0.9 % IV SOLN
INTRAVENOUS | Status: DC
  Administered 2018-10-13 – 2018-10-14 (×2): via INTRAVENOUS

## 2018-10-13 MED ORDER — NEOMYCIN-POLYMYXIN B GU 40-200000 IR SOLN
Status: DC | PRN
  Administered 2018-10-13: 14 mL

## 2018-10-13 MED ORDER — METOCLOPRAMIDE HCL 10 MG PO TABS
10.0000 mg | ORAL_TABLET | Freq: Three times a day (TID) | ORAL | Status: DC
  Administered 2018-10-13 – 2018-10-15 (×6): 10 mg via ORAL
  Filled 2018-10-13 (×6): qty 1

## 2018-10-13 MED ORDER — SODIUM CHLORIDE 0.9 % IV SOLN
INTRAVENOUS | Status: DC | PRN
  Administered 2018-10-13: 60 mL

## 2018-10-13 MED ORDER — TRANEXAMIC ACID-NACL 1000-0.7 MG/100ML-% IV SOLN
1000.0000 mg | Freq: Once | INTRAVENOUS | Status: AC
  Administered 2018-10-13: 1000 mg via INTRAVENOUS
  Filled 2018-10-13: qty 100

## 2018-10-13 MED ORDER — CELECOXIB 200 MG PO CAPS
200.0000 mg | ORAL_CAPSULE | Freq: Two times a day (BID) | ORAL | Status: DC
  Administered 2018-10-13 – 2018-10-15 (×4): 200 mg via ORAL
  Filled 2018-10-13 (×4): qty 1

## 2018-10-13 MED ORDER — ENSURE PRE-SURGERY PO LIQD
296.0000 mL | Freq: Once | ORAL | Status: AC
  Administered 2018-10-13: 296 mL via ORAL
  Filled 2018-10-13: qty 296

## 2018-10-13 MED ORDER — CEFAZOLIN SODIUM-DEXTROSE 2-4 GM/100ML-% IV SOLN
2.0000 g | Freq: Four times a day (QID) | INTRAVENOUS | Status: AC
  Administered 2018-10-13 – 2018-10-14 (×4): 2 g via INTRAVENOUS
  Filled 2018-10-13 (×4): qty 100

## 2018-10-13 MED ORDER — PHENOL 1.4 % MT LIQD
1.0000 | OROMUCOSAL | Status: DC | PRN
  Filled 2018-10-13: qty 177

## 2018-10-13 MED ORDER — FLEET ENEMA 7-19 GM/118ML RE ENEM: 1.0000 | ENEMA | Freq: Once | RECTAL | Status: DC | PRN

## 2018-10-13 MED ORDER — SENNOSIDES-DOCUSATE SODIUM 8.6-50 MG PO TABS
1.0000 | ORAL_TABLET | Freq: Two times a day (BID) | ORAL | Status: DC
  Administered 2018-10-13 – 2018-10-15 (×4): 1 via ORAL
  Filled 2018-10-13 (×4): qty 1

## 2018-10-13 MED ORDER — BUPIVACAINE HCL (PF) 0.25 % IJ SOLN
INTRAMUSCULAR | Status: DC | PRN
  Administered 2018-10-13: 60 mL

## 2018-10-13 MED ORDER — LACTATED RINGERS IV SOLN
INTRAVENOUS | Status: DC
  Administered 2018-10-13 (×2): via INTRAVENOUS

## 2018-10-13 MED ORDER — ONDANSETRON HCL 4 MG/2ML IJ SOLN: 4.0000 mg | Freq: Four times a day (QID) | INTRAMUSCULAR | Status: DC | PRN

## 2018-10-13 MED ORDER — MIDAZOLAM HCL 2 MG/2ML IJ SOLN
INTRAMUSCULAR | Status: AC
  Filled 2018-10-13: qty 2

## 2018-10-13 MED ORDER — FENTANYL CITRATE (PF) 100 MCG/2ML IJ SOLN: 25.0000 ug | INTRAMUSCULAR | Status: DC | PRN

## 2018-10-13 MED ORDER — PROPOFOL 10 MG/ML IV BOLUS
INTRAVENOUS | Status: DC | PRN
  Administered 2018-10-13 (×3): 18 mg via INTRAVENOUS

## 2018-10-13 MED ORDER — HYDROMORPHONE HCL 1 MG/ML IJ SOLN: 0.5000 mg | INTRAMUSCULAR | Status: DC | PRN

## 2018-10-13 MED ORDER — PHENYLEPHRINE HCL (PRESSORS) 10 MG/ML IV SOLN: INTRAVENOUS | Status: DC | PRN

## 2018-10-13 MED ORDER — PROMETHAZINE HCL 25 MG/ML IJ SOLN: 6.2500 mg | INTRAMUSCULAR | Status: DC | PRN

## 2018-10-13 MED ORDER — BISACODYL 10 MG RE SUPP: 10.0000 mg | Freq: Every day | RECTAL | Status: DC | PRN

## 2018-10-13 MED ORDER — SODIUM CHLORIDE 0.9 % IV SOLN
INTRAVENOUS | Status: DC | PRN
  Administered 2018-10-13: 30 ug/min via INTRAVENOUS

## 2018-10-13 MED ORDER — PROPOFOL 500 MG/50ML IV EMUL
INTRAVENOUS | Status: DC | PRN
  Administered 2018-10-13: 50 ug/kg/min via INTRAVENOUS

## 2018-10-13 MED ORDER — GABAPENTIN 300 MG PO CAPS
300.0000 mg | ORAL_CAPSULE | Freq: Once | ORAL | Status: AC
  Administered 2018-10-13: 300 mg via ORAL

## 2018-10-13 MED ORDER — OXYCODONE HCL 5 MG PO TABS: 10.0000 mg | ORAL_TABLET | ORAL | Status: DC | PRN

## 2018-10-13 MED ORDER — MENTHOL 3 MG MT LOZG
1.0000 | LOZENGE | OROMUCOSAL | Status: DC | PRN
  Filled 2018-10-13: qty 9

## 2018-10-13 MED ORDER — ACETAMINOPHEN 10 MG/ML IV SOLN
INTRAVENOUS | Status: DC | PRN
  Administered 2018-10-13: 1000 mg via INTRAVENOUS

## 2018-10-13 MED ORDER — FLUTICASONE PROPIONATE 50 MCG/ACT NA SUSP
2.0000 | Freq: Every day | NASAL | Status: DC | PRN
  Filled 2018-10-13: qty 16

## 2018-10-13 MED ORDER — ONDANSETRON HCL 4 MG/2ML IJ SOLN
INTRAMUSCULAR | Status: DC | PRN
  Administered 2018-10-13: 4 mg via INTRAVENOUS

## 2018-10-13 MED ORDER — GABAPENTIN 300 MG PO CAPS
300.0000 mg | ORAL_CAPSULE | Freq: Every day | ORAL | Status: DC
  Administered 2018-10-13 – 2018-10-14 (×2): 300 mg via ORAL
  Filled 2018-10-13 (×2): qty 1

## 2018-10-13 MED ORDER — TETRACAINE HCL 1 % IJ SOLN
INTRAMUSCULAR | Status: DC | PRN
  Administered 2018-10-13: 5 mg via INTRASPINAL

## 2018-10-13 MED ORDER — MIDAZOLAM HCL 5 MG/5ML IJ SOLN
INTRAMUSCULAR | Status: DC | PRN
  Administered 2018-10-13 (×2): 1 mg via INTRAVENOUS

## 2018-10-13 MED ORDER — METOCLOPRAMIDE HCL 10 MG PO TABS: 5.0000 mg | ORAL_TABLET | Freq: Three times a day (TID) | ORAL | Status: DC | PRN

## 2018-10-13 MED ORDER — FERROUS SULFATE 325 (65 FE) MG PO TABS
325.0000 mg | ORAL_TABLET | Freq: Two times a day (BID) | ORAL | Status: DC
  Administered 2018-10-13 – 2018-10-15 (×4): 325 mg via ORAL
  Filled 2018-10-13 (×4): qty 1

## 2018-10-13 MED ORDER — BUPIVACAINE HCL (PF) 0.5 % IJ SOLN
INTRAMUSCULAR | Status: DC | PRN
  Administered 2018-10-13: 2.5 mL

## 2018-10-13 MED ORDER — FENTANYL CITRATE (PF) 100 MCG/2ML IJ SOLN
INTRAMUSCULAR | Status: DC | PRN
  Administered 2018-10-13: 100 ug via INTRAVENOUS

## 2018-10-13 MED ORDER — METOCLOPRAMIDE HCL 5 MG/ML IJ SOLN: 5.0000 mg | Freq: Three times a day (TID) | INTRAMUSCULAR | Status: DC | PRN

## 2018-10-13 MED ORDER — DIPHENHYDRAMINE HCL 12.5 MG/5ML PO ELIX: 12.5000 mg | ORAL_SOLUTION | ORAL | Status: DC | PRN

## 2018-10-13 MED ORDER — ACETAMINOPHEN 325 MG PO TABS: 325.0000 mg | ORAL_TABLET | Freq: Four times a day (QID) | ORAL | Status: DC | PRN

## 2018-10-13 MED ORDER — FENTANYL CITRATE (PF) 100 MCG/2ML IJ SOLN
INTRAMUSCULAR | Status: AC
  Filled 2018-10-13: qty 2

## 2018-10-13 MED ORDER — CELECOXIB 200 MG PO CAPS
400.0000 mg | ORAL_CAPSULE | Freq: Once | ORAL | Status: AC
  Administered 2018-10-13: 400 mg via ORAL

## 2018-10-13 MED ORDER — LOSARTAN POTASSIUM 50 MG PO TABS
50.0000 mg | ORAL_TABLET | ORAL | Status: DC
  Administered 2018-10-15: 50 mg via ORAL
  Filled 2018-10-13: qty 1

## 2018-10-13 SURGICAL SUPPLY — 74 items
ATTUNE MED DOME PAT 38 KNEE (Knees) ×1 IMPLANT
ATTUNE MED DOME PAT 38MM KNEE (Knees) ×1 IMPLANT
ATTUNE PS FEM LT SZ 7 CEM KNEE (Femur) ×2 IMPLANT
ATTUNE PSRP INSR SZ7 6 KNEE (Insert) ×1 IMPLANT
ATTUNE PSRP INSR SZ7 6MM KNEE (Insert) ×1 IMPLANT
BASE TIBIAL ROT PLAT SZ 7 KNEE (Knees) IMPLANT
BATTERY INSTRU NAVIGATION (MISCELLANEOUS) ×12 IMPLANT
BLADE SAW 70X12.5 (BLADE) ×3 IMPLANT
BLADE SAW 90X13X1.19 OSCILLAT (BLADE) ×3 IMPLANT
BLADE SAW 90X25X1.19 OSCILLAT (BLADE) ×3 IMPLANT
BNDG STRETCH 4X75 STRL LF (GAUZE/BANDAGES/DRESSINGS) ×2 IMPLANT
CANISTER SUCT 3000ML PPV (MISCELLANEOUS) ×3 IMPLANT
CEMENT HV SMART SET (Cement) ×6 IMPLANT
COOLER POLAR GLACIER W/PUMP (MISCELLANEOUS) ×3 IMPLANT
COVER WAND RF STERILE (DRAPES) ×3 IMPLANT
CUFF TOURN SGL QUICK 24 (TOURNIQUET CUFF)
CUFF TOURN SGL QUICK 30 (TOURNIQUET CUFF) ×2
CUFF TRNQT CYL 24X4X16.5-23 (TOURNIQUET CUFF) IMPLANT
CUFF TRNQT CYL 30X4X21-28X (TOURNIQUET CUFF) IMPLANT
DRAPE 3/4 80X56 (DRAPES) ×3 IMPLANT
DRAPE SHEET LG 3/4 BI-LAMINATE (DRAPES) ×2 IMPLANT
DRSG DERMACEA 8X12 NADH (GAUZE/BANDAGES/DRESSINGS) ×3 IMPLANT
DRSG OPSITE POSTOP 4X12 (GAUZE/BANDAGES/DRESSINGS) ×2 IMPLANT
DRSG OPSITE POSTOP 4X14 (GAUZE/BANDAGES/DRESSINGS) ×3 IMPLANT
DRSG TEGADERM 4X4.75 (GAUZE/BANDAGES/DRESSINGS) ×3 IMPLANT
DURAPREP 26ML APPLICATOR (WOUND CARE) ×6 IMPLANT
ELECT REM PT RETURN 9FT ADLT (ELECTROSURGICAL) ×3
ELECTRODE REM PT RTRN 9FT ADLT (ELECTROSURGICAL) ×1 IMPLANT
EX-PIN ORTHOLOCK NAV 4X150 (PIN) ×6 IMPLANT
GAUZE SPONGE 4X4 12PLY STRL (GAUZE/BANDAGES/DRESSINGS) ×2 IMPLANT
GLOVE BIOGEL M STRL SZ7.5 (GLOVE) ×6 IMPLANT
GLOVE INDICATOR 8.0 STRL GRN (GLOVE) ×3 IMPLANT
GOWN STRL REUS W/ TWL LRG LVL3 (GOWN DISPOSABLE) ×2 IMPLANT
GOWN STRL REUS W/TWL LRG LVL3 (GOWN DISPOSABLE) ×4
HEMOVAC 400CC 10FR (MISCELLANEOUS) ×3 IMPLANT
HOLDER FOLEY CATH W/STRAP (MISCELLANEOUS) ×3 IMPLANT
HOOD PEEL AWAY FLYTE STAYCOOL (MISCELLANEOUS) ×6 IMPLANT
KIT TURNOVER KIT A (KITS) ×3 IMPLANT
KNIFE SCULPS 14X20 (INSTRUMENTS) ×3 IMPLANT
LABEL OR SOLS (LABEL) ×3 IMPLANT
MANIFOLD NEPTUNE II (INSTRUMENTS) ×3 IMPLANT
NDL SAFETY ECLIPSE 18X1.5 (NEEDLE) ×1 IMPLANT
NDL SPNL 20GX3.5 QUINCKE YW (NEEDLE) ×2 IMPLANT
NEEDLE HYPO 18GX1.5 SHARP (NEEDLE) ×2
NEEDLE SPNL 20GX3.5 QUINCKE YW (NEEDLE) ×6 IMPLANT
NS IRRIG 500ML POUR BTL (IV SOLUTION) ×3 IMPLANT
PACK TOTAL KNEE (MISCELLANEOUS) ×3 IMPLANT
PAD ABD DERMACEA PRESS 5X9 (GAUZE/BANDAGES/DRESSINGS) ×2 IMPLANT
PAD WRAPON POLAR KNEE (MISCELLANEOUS) ×1 IMPLANT
PENCIL SMOKE ULTRAEVAC 22 CON (MISCELLANEOUS) ×3 IMPLANT
PIN FIXATION 1/8DIA X 3INL (PIN) ×9 IMPLANT
PULSAVAC PLUS IRRIG FAN TIP (DISPOSABLE) ×3
SOL .9 NS 3000ML IRR  AL (IV SOLUTION) ×2
SOL .9 NS 3000ML IRR UROMATIC (IV SOLUTION) ×1 IMPLANT
SOL PREP PVP 2OZ (MISCELLANEOUS) ×3
SOLUTION PREP PVP 2OZ (MISCELLANEOUS) ×1 IMPLANT
SPONGE DRAIN TRACH 4X4 STRL 2S (GAUZE/BANDAGES/DRESSINGS) ×3 IMPLANT
STAPLER SKIN PROX 35W (STAPLE) ×3 IMPLANT
STOCKINETTE BIAS CUT 4 980044 (GAUZE/BANDAGES/DRESSINGS) ×2 IMPLANT
STOCKINETTE IMPERV 14X48 (MISCELLANEOUS) IMPLANT
STRAP TIBIA SHORT (MISCELLANEOUS) ×3 IMPLANT
SUCTION FRAZIER HANDLE 10FR (MISCELLANEOUS) ×2
SUCTION TUBE FRAZIER 10FR DISP (MISCELLANEOUS) ×1 IMPLANT
SUT VIC AB 0 CT1 36 (SUTURE) ×3 IMPLANT
SUT VIC AB 1 CT1 36 (SUTURE) ×6 IMPLANT
SUT VIC AB 2-0 CT2 27 (SUTURE) ×3 IMPLANT
SYR 20ML LL LF (SYRINGE) ×3 IMPLANT
SYR 30ML LL (SYRINGE) ×6 IMPLANT
TIBIAL BASE ROT PLAT SZ 7 KNEE (Knees) ×3 IMPLANT
TIP FAN IRRIG PULSAVAC PLUS (DISPOSABLE) ×1 IMPLANT
TOWEL OR 17X26 4PK STRL BLUE (TOWEL DISPOSABLE) ×3 IMPLANT
TOWER CARTRIDGE SMART MIX (DISPOSABLE) ×3 IMPLANT
TRAY FOLEY MTR SLVR 16FR STAT (SET/KITS/TRAYS/PACK) ×3 IMPLANT
WRAPON POLAR PAD KNEE (MISCELLANEOUS) ×3

## 2018-10-13 NOTE — H&P (Signed)
The patient has been re-examined, and the chart reviewed, and there have been no interval changes to the documented history and physical.    The risks, benefits, and alternatives have been discussed at length. The patient expressed understanding of the risks benefits and agreed with plans for surgical intervention.  James P. Hooten, Jr. M.D.    

## 2018-10-13 NOTE — Transfer of Care (Signed)
Immediate Anesthesia Transfer of Care Note  Patient: Frank Stephens  Procedure(s) Performed: COMPUTER ASSISTED LEFT TOTAL KNEE ARTHROPLASTY (Left Knee)  Patient Location: PACU  Anesthesia Type:Spinal  Level of Consciousness: drowsy and patient cooperative  Airway & Oxygen Therapy: Patient Spontanous Breathing and Patient connected to face mask oxygen  Post-op Assessment: Report given to RN and Post -op Vital signs reviewed and stable  Post vital signs: Reviewed and stable  Last Vitals:  Vitals Value Taken Time  BP 105/81 10/13/18 1545  Temp 36.5 C 10/13/18 1545  Pulse 91 10/13/18 1546  Resp 16 10/13/18 1546  SpO2 99 % 10/13/18 1546  Vitals shown include unvalidated device data.  Last Pain:  Vitals:   10/13/18 0947  TempSrc: Temporal  PainSc: 0-No pain         Complications: No apparent anesthesia complications

## 2018-10-13 NOTE — Anesthesia Procedure Notes (Signed)
Spinal  Patient location during procedure: OR Start time: 10/13/2018 12:06 PM End time: 10/13/2018 12:12 PM Staffing Resident/CRNA: Bernardo Heater, CRNA Performed: resident/CRNA  Preanesthetic Checklist Completed: patient identified, site marked, surgical consent, pre-op evaluation, timeout performed, IV checked, risks and benefits discussed and monitors and equipment checked Spinal Block Patient position: sitting Prep: ChloraPrep Patient monitoring: heart rate, continuous pulse ox, blood pressure and cardiac monitor Approach: midline Location: L4-5 Injection technique: single-shot Needle Needle type: Introducer and Pencan  Needle gauge: 24 G Needle length: 9 cm Additional Notes Negative paresthesia. Negative blood return. Positive free-flowing CSF. Expiration date of kit checked and confirmed. Patient tolerated procedure well, without complications.

## 2018-10-13 NOTE — Discharge Instructions (Signed)
°  Instructions after Total Knee Replacement ° ° Siria Calandro P. Jetaime Pinnix, Jr., M.D.    ° Dept. of Orthopaedics & Sports Medicine ° [facility] ° [address] ° [place], [place]  [address] ° Phone: [phone]   Fax: [phone] ° °  °DIET: °• Drink plenty of non-alcoholic fluids. °• Resume your normal diet. Include foods high in fiber. ° °ACTIVITY:  °• You may use crutches or a walker with weight-bearing as tolerated, unless instructed otherwise. °• You may be weaned off of the walker or crutches by your Physical Therapist.  °• Do NOT place pillows under the knee. Anything placed under the knee could limit your ability to straighten the knee.   °• Continue doing gentle exercises. Exercising will reduce the pain and swelling, increase motion, and prevent muscle weakness.   °• Please continue to use the TED compression stockings for 6 weeks. You may remove the stockings at night, but should reapply them in the morning. °• Do not drive or operate any equipment until instructed. ° °WOUND CARE:  °• Continue to use the PolarCare or ice packs periodically to reduce pain and swelling. °• You may bathe or shower after the staples are removed at the first office visit following surgery. ° °MEDICATIONS: °• You may resume your regular medications. °• Please take the pain medication as prescribed on the medication. °• Do not take pain medication on an empty stomach. °• You have been given a prescription for a blood thinner (Lovenox or Coumadin). Please take the medication as instructed. (NOTE: After completing a 2 week course of Lovenox, take one Enteric-coated aspirin once a day. This along with elevation will help reduce the possibility of phlebitis in your operated leg.) °• Do not drive or drink alcoholic beverages when taking pain medications. ° °CALL THE OFFICE FOR: °• Temperature above 101 degrees °• Excessive bleeding or drainage on the dressing. °• Excessive swelling, coldness, or paleness of the toes. °• Persistent  nausea and vomiting. ° °FOLLOW-UP:  °• You should have an appointment to return to the office in 10-14 days after surgery. °• Arrangements have been made for continuation of Physical Therapy (either home therapy or outpatient therapy). °  °

## 2018-10-13 NOTE — Anesthesia Post-op Follow-up Note (Signed)
Anesthesia QCDR form completed.        

## 2018-10-13 NOTE — Op Note (Signed)
OPERATIVE NOTE  DATE OF SURGERY:  10/13/2018  PATIENT NAME:  Frank Stephens   DOB: [birth year]  MRN: [id number]  PRE-OPERATIVE DIAGNOSIS: Degenerative arthrosis of the left knee, primary  POST-OPERATIVE DIAGNOSIS:  Same  PROCEDURE:  Left total knee arthroplasty using computer-assisted navigation  SURGEON:  Jena GaussJames P Hooten, Jr. M.D.  ASSISTANT: Unknown FoleyMary Beth Sillman, RN (present and scrubbed throughout the case, critical for assistance with exposure, retraction, instrumentation, and closure)  ANESTHESIA: spinal  ESTIMATED BLOOD LOSS: 50 mL  FLUIDS REPLACED: 1200 mL of crystalloid  TOURNIQUET TIME: 103 minutes  DRAINS: 2 medium Hemovac drains  SOFT TISSUE RELEASES: Anterior cruciate ligament, posterior cruciate ligament, deep medial collateral ligament, patellofemoral ligament  IMPLANTS UTILIZED: DePuy Attune size 7 posterior stabilized femoral component (cemented), size 7 rotating platform tibial component (cemented), 38 mm medialized dome patella (cemented), and a 6 mm stabilized rotating platform polyethylene insert.  INDICATIONS FOR SURGERY: Frank Stephens is a 68 y.o. year old male with a long history of progressive knee pain. X-rays demonstrated severe degenerative changes in tricompartmental fashion. The patient had not seen any significant improvement despite conservative nonsurgical intervention. After discussion of the risks and benefits of surgical intervention, the patient expressed understanding of the risks benefits and agree with plans for total knee arthroplasty.   The risks, benefits, and alternatives were discussed at length including but not limited to the risks of infection, bleeding, nerve injury, stiffness, blood clots, the need for revision surgery, cardiopulmonary complications, among others, and they were willing to proceed.  PROCEDURE IN DETAIL: The patient was brought into the operating room and, after adequate spinal anesthesia was achieved, a  tourniquet was placed on the patient's upper thigh. The patient's knee and leg were cleaned and prepped with alcohol and DuraPrep and draped in the usual sterile fashion. A "timeout" was performed as per usual protocol. The lower extremity was exsanguinated using an Esmarch, and the tourniquet was inflated to 300 mmHg. An anterior longitudinal incision was made followed by a standard mid vastus approach. The deep fibers of the medial collateral ligament were elevated in a subperiosteal fashion off of the medial flare of the tibia so as to maintain a continuous soft tissue sleeve. The patella was subluxed laterally and the patellofemoral ligament was incised. Inspection of the knee demonstrated severe degenerative changes with full-thickness loss of articular cartilage. Osteophytes were debrided using a rongeur. Anterior and posterior cruciate ligaments were excised. Two 4.0 mm Schanz pins were inserted in the femur and into the tibia for attachment of the array of trackers used for computer-assisted navigation. Hip center was identified using a circumduction technique. Distal landmarks were mapped using the computer. The distal femur and proximal tibia were mapped using the computer. The distal femoral cutting guide was positioned using computer-assisted navigation so as to achieve a 5 distal valgus cut. The femur was sized and it was felt that a size 7 femoral component was appropriate. A size 7 femoral cutting guide was positioned and the anterior cut was performed and verified using the computer. This was followed by completion of the posterior and chamfer cuts. Femoral cutting guide for the central box was then positioned in the center box cut was performed.  Attention was then directed to the proximal tibia. Medial and lateral menisci were excised. The extramedullary tibial cutting guide was positioned using computer-assisted navigation so as to achieve a 0 varus-valgus alignment and 3 posterior slope. The  cut was performed and verified using the computer. The proximal tibia  was sized and it was felt that a size 7 tibial tray was appropriate. Tibial and femoral trials were inserted followed by insertion of a 6 mm polyethylene insert. This allowed for excellent mediolateral soft tissue balancing both in flexion and in full extension. Finally, the patella was cut and prepared so as to accommodate a 38 mm medialized dome patella. A patella trial was placed and the knee was placed through a range of motion with excellent patellar tracking appreciated. The femoral trial was removed after debridement of posterior osteophytes. The central post-hole for the tibial component was reamed followed by insertion of a keel punch. Tibial trials were then removed. Cut surfaces of bone were irrigated with copious amounts of normal saline with antibiotic solution using pulsatile lavage and then suctioned dry. Polymethylmethacrylate cement was prepared in the usual fashion using a vacuum mixer. Cement was applied to the cut surface of the proximal tibia as well as along the undersurface of a size 7 rotating platform tibial component. Tibial component was positioned and impacted into place. Excess cement was removed using [employer] fast streamer. Cement was then applied to the cut surfaces of the femur as well as along the posterior flanges of the size 7 femoral component. The femoral component was positioned and impacted into place. Excess cement was removed using [employer] fast streamer. A 6 mm polyethylene trial was inserted and the knee was brought into full extension with steady axial compression applied. Finally, cement was applied to the backside of a 38 mm medialized dome patella and the patellar component was positioned and patellar clamp applied. Excess cement was removed using [employer] fast streamer. After adequate curing of the cement, the tourniquet was deflated after a total tourniquet time of 103 minutes. Hemostasis was achieved using  electrocautery. The knee was irrigated with copious amounts of normal saline with antibiotic solution using pulsatile lavage and then suctioned dry. 20 mL of 1.3% Exparel and 60 mL of 0.25% Marcaine in 40 mL of normal saline was injected along the posterior capsule, medial and lateral gutters, and along the arthrotomy site. A 6 mm stabilized rotating platform polyethylene insert was inserted and the knee was placed through a range of motion with excellent mediolateral soft tissue balancing appreciated and excellent patellar tracking noted. 2 medium drains were placed in the wound bed and brought out through separate stab incisions. The medial parapatellar portion of the incision was reapproximated using interrupted sutures of #1 Vicryl. Subcutaneous tissue was approximated in layers using first #0 Vicryl followed #2-0 Vicryl. The skin was approximated with skin staples. A sterile dressing was applied.  The patient tolerated the procedure well and was transported to the recovery room in stable condition.    James P. Holley Bouche., M.D.

## 2018-10-13 NOTE — Anesthesia Preprocedure Evaluation (Signed)
Anesthesia Evaluation  Patient identified by MRN, date of birth, ID band Patient awake    Reviewed: Allergy & Precautions, H&P , NPO status , Patient's Chart, lab work & pertinent test results, reviewed documented beta blocker date and time   History of Anesthesia Complications (+) PONV and history of anesthetic complications  Airway Mallampati: II  TM Distance: >3 FB Neck ROM: full    Dental  (+) Caps, Dental Advidsory Given, Teeth Intact   Pulmonary neg shortness of breath, sleep apnea and Continuous Positive Airway Pressure Ventilation , neg COPD, neg recent URI,           Cardiovascular Exercise Tolerance: Good hypertension, (-) angina(-) CAD, (-) Past MI, (-) Cardiac Stents and (-) CABG (-) dysrhythmias (-) Valvular Problems/Murmurs     Neuro/Psych negative neurological ROS  negative psych ROS   GI/Hepatic Neg liver ROS, GERD  Controlled and Medicated,  Endo/Other  negative endocrine ROS  Renal/GU negative Renal ROS  negative genitourinary   Musculoskeletal   Abdominal   Peds  Hematology negative hematology ROS (+)   Anesthesia Other Findings Past Medical History: No date: Acid reflux No date: Hypertension No date: PONV (postoperative nausea and vomiting) 2013: Sleep apnea   Reproductive/Obstetrics negative OB ROS                             Anesthesia Physical  Anesthesia Plan  ASA: II  Anesthesia Plan: Spinal   Post-op Pain Management:    Induction:   PONV Risk Score and Plan: Propofol infusion and TIVA  Airway Management Planned: Simple Face Mask and Natural Airway  Additional Equipment:   Intra-op Plan:   Post-operative Plan:   Informed Consent: I have reviewed the patients History and Physical, chart, labs and discussed the procedure including the risks, benefits and alternatives for the proposed anesthesia with the patient or authorized representative who has  indicated his/her understanding and acceptance.     Dental Advisory Given  Plan Discussed with: Anesthesiologist, CRNA and Surgeon  Anesthesia Plan Comments:         Anesthesia Quick Evaluation

## 2018-10-14 NOTE — Progress Notes (Addendum)
Physical Therapy Treatment Patient Details Name: Frank Stephens MRN: [id number] DOB: November 04, 1950 Today's Date: 10/14/2018    History of Present Illness "Frank Stephens" [place] is a 68yo Male who [facility] for Left TKA to address chronic Left knee DJD. Pt underwent Rt TKA in 5MA for same. Pt is a recently retired Paramedicpostal worker.    PT Comments    Author returning for BID PT session this date. Pt issued handout with HEP and precautions. All HEP activity reviewed in full and asked pt to perform later today independently. Pt shown how to use a bed sheet to perform AA/ROM for hele slides. ROM remains excellent, pt exceeding 90 degrees within session with only mild tightness at lateral knee, but denies actual pain. Pt able to repeat same distance AMB as in morning session, just under 22200ft this time with great facility. Assisted pt to BR after AMB where he showed good balance without UE use during standing voiding. Pt progressing well. Will plan to review stairs with patient tomorrow on POD2.   Follow Up Recommendations  Follow surgeon's recommendation for DC plan and follow-up therapies     Equipment Recommendations  None recommended by PT    Recommendations for Other Services       Precautions / Restrictions Precautions Precautions: Knee;Fall Precaution Booklet Issued: Yes (comment) Restrictions LLE Weight Bearing: Weight bearing as tolerated    Mobility  Bed Mobility Overal bed mobility: Modified Independent                Transfers Overall transfer level: Needs assistance Equipment used: Rolling walker (2 wheeled) Transfers: Sit to/from Stand Sit to Stand: Supervision         General transfer comment: reminder to keep RW close during transitions  Ambulation/Gait Ambulation/Gait assistance: Supervision Gait Distance (Feet): 190 Feet(limited by Thereasa Parkinauthor) Assistive device: Rolling walker (2 wheeled) Gait Pattern/deviations: WFL(Within Functional Limits)      General Gait Details: naturally progresses to a step-through 2-point gait Rt step limited by 25%.   Stairs             Wheelchair Mobility    Modified Rankin (Stroke Patients Only)       Balance Overall balance assessment: Modified Independent;No apparent balance deficits (not formally assessed)                                          Cognition Arousal/Alertness: Awake/alert Behavior During Therapy: WFL for tasks assessed/performed Overall Cognitive Status: Within Functional Limits for tasks assessed                                        Exercises Total Joint Exercises Ankle Circles/Pumps: AROM;20 reps;Both;Supine Quad Sets: Left;AROM;Supine;5 reps Short Arc Quad: AROM;Supine;Left;5 reps Heel Slides: AAROM;Left;Supine;5 reps;Limitations Heel Slides Limitations: self assisted AA/ROM with bed sheet Hip ABduction/ADduction: AROM;Left;Supine;5 reps Straight Leg Raises: AROM;Left;5 reps;Supine Long Arc Quad: Left;AROM;Seated;10 reps Knee Flexion: 10 reps;Left;Seated    General Comments        Pertinent Vitals/Pain Pain Assessment: No/denies pain Pain Intervention(s): Monitored during session;Premedicated before session    Home Living                      Prior Function            PT Goals (current  goals can now be found in the care plan section) Acute Rehab PT Goals Patient Stated Goal: return to home with outcome as good as other knee PT Goal Formulation: With patient Time For Goal Achievement: 10/28/18 Potential to Achieve Goals: Good Progress towards PT goals: Progressing toward goals    Frequency    BID      PT Plan Current plan remains appropriate    Co-evaluation              AM-PAC PT "6 Clicks" Mobility   Outcome Measure  Help needed turning from your back to your side while in a flat bed without using bedrails?: None Help needed moving from lying on your back to sitting on the side  of a flat bed without using bedrails?: None Help needed moving to and from a bed to a chair (including a wheelchair)?: None Help needed standing up from a chair using your arms (e.g., wheelchair or bedside chair)?: None Help needed to walk in hospital room?: A Little Help needed climbing 3-5 steps with a railing? : A Little 6 Click Score: 22    End of Session Equipment Utilized During Treatment: Gait belt Activity Tolerance: Patient tolerated treatment well;No increased pain Patient left: with call bell/phone within reach;in bed;with bed alarm set Nurse Communication: Mobility status PT Visit Diagnosis: Difficulty in walking, not elsewhere classified (R26.2);Muscle weakness (generalized) (M62.81);Other abnormalities of gait and mobility (R26.89)     Time: 5056-9794 PT Time Calculation (min) (ACUTE ONLY): 27 min  Charges:  $Gait Training: 8-22 mins $Therapeutic Exercise: 8-22 mins                     3:58 PM, 10/14/18 Etta Grandchild, PT, DPT Physical Therapist - [facility]  [phone] ([place])    [place] 10/14/2018, 3:55 PM

## 2018-10-14 NOTE — Progress Notes (Signed)
  Subjective: 1 Day Post-Op Procedure(s) (LRB): COMPUTER ASSISTED LEFT TOTAL KNEE ARTHROPLASTY (Left) Patient reports pain as mild.   Patient seen in rounds with Dr. Marry Guan. Patient is well, and has had no acute complaints or problems Plan is to go Home after hospital stay. Negative for chest pain and shortness of breath Fever: no Gastrointestinal: Negative for nausea and vomiting  Objective: Vital signs in last 24 hours: Temp:  [97.5 F (36.4 C)-98.6 F (37 C)] 97.6 F (36.4 C) (07/23 0446) Pulse Rate:  [79-106] 89 (07/23 0446) Resp:  [8-21] 17 (07/23 0446) BP: (105-154)/(55-93) 117/68 (07/23 0446) SpO2:  [76 %-100 %] 96 % (07/23 0446) Weight:  [94.2 kg] 94.2 kg (07/22 0947)  Intake/Output from previous day:  Intake/Output Summary (Last 24 hours) at 10/14/2018 5284 Last data filed at 10/14/2018 0631 Gross per 24 hour  Intake 2273.6 ml  Output 2290 ml  Net -16.4 ml    Intake/Output this shift: Total I/O In: 593.6 [I.V.:193.6; IV Piggyback:400] Out: 1760 [Urine:1400; Drains:360]  Labs: No results for input(s): HGB in the last 72 hours. No results for input(s): WBC, RBC, HCT, PLT in the last 72 hours. No results for input(s): NA, K, CL, CO2, BUN, CREATININE, GLUCOSE, CALCIUM in the last 72 hours. No results for input(s): LABPT, INR in the last 72 hours.   EXAM General - Patient is Alert and Oriented Extremity - Neurovascular intact Sensation intact distally Compartment soft Dressing/Incision - clean, dry, with a Hemovac intact Motor Function - intact, moving foot and toes well on exam.  Able to straight leg raise independently  Past Medical History:  Diagnosis Date  . Acid reflux   . Arthritis   . Dysrhythmia   . Hypertension   . PONV (postoperative nausea and vomiting)    pt had no n/v with his knee replacement in feb 2020  . Sleep apnea 2013   cpap  . Thyroid nodule     Assessment/Plan: 1 Day Post-Op Procedure(s) (LRB): COMPUTER ASSISTED LEFT TOTAL  KNEE ARTHROPLASTY (Left) Active Problems:   Total knee replacement status  Estimated body mass index is 29.8 kg/m as calculated from the following:   Height as of this encounter: 5\' 10"  (1.778 m).   Weight as of this encounter: 94.2 kg. Advance diet Up with therapy D/C IV fluids Discharge home with home health planned for tomorrow.  The patient has pain medication at home and does not need a new prescription.  DVT Prophylaxis - Lovenox, Foot Pumps and TED hose Weight-Bearing as tolerated to left leg  Reche Dixon, PA-C Orthopaedic Surgery 10/14/2018, 6:32 AM

## 2018-10-14 NOTE — TOC Initial Note (Signed)
Transition of Care [facility]) - Initial/Assessment Note    Patient Details  Name: Frank Stephens MRN: [id number] Date of Birth: 03/11/1951  Transition of Care [facility]) CM/SW Contact:    Su Hilt, RN Phone Number: 10/14/2018, 2:37 PM  Clinical Narrative:                 Met with the patient to discuss DC plan and needs He lives at home with his spouse and has a RW, BSC and cane His wife provides transportation when needed He is up to date with his PCP visits [facility] [place] and can afford his medication, I requested the price of Lovenox and will notify the patient once obtained  He wished to use [facility] as he has used them in the past, I notified Helene Kelp NO further needs at this time  Expected Discharge Plan: [place] Barriers to Discharge: Continued Medical Work up   Patient Goals and CMS Choice Patient states their goals for this hospitalization and ongoing recovery are:: go home CMS Medicare.gov Compare Post Acute Care list provided to:: Patient Choice offered to / list presented to : Patient  Expected Discharge Plan and Services Expected Discharge Plan: [place]   Discharge Planning Services: CM Consult Post Acute Care Choice: [place] arrangements for the past 2 months: Single Family Home                 DME Arranged: N/A         HH Arranged: PT HH Agency: [facility] [facility] [employer]   Time [place]: 47 Representative spoke with at [place]: [place] Arrangements/Services Living arrangements for the past 2 months: [place] with:: Spouse Patient language and need for interpreter reviewed:: No Do you feel safe going back to the place where you live?: Yes      Need for Family Participation in Patient Care: No (Comment) Care giver support system in place?: Yes (comment) Current home services: DME(RW, 3 in 1 , cane) Criminal  Activity/Legal Involvement Pertinent to Current Situation/Hospitalization: No - Comment as needed  Activities of Daily Living Home Assistive Devices/Equipment: Eyeglasses ADL Screening (condition at time of admission) Patient's cognitive ability adequate to safely complete daily activities?: Yes Is the patient deaf or have difficulty hearing?: No Does the patient have difficulty seeing, even when wearing glasses/contacts?: No Does the patient have difficulty concentrating, remembering, or making decisions?: No Patient able to express need for assistance with ADLs?: Yes Does the patient have difficulty dressing or bathing?: No Independently performs ADLs?: Yes (appropriate for developmental age) Does the patient have difficulty walking or climbing stairs?: No Weakness of Legs: None Weakness of Arms/Hands: None  Permission Sought/Granted   Permission granted to share information with : Yes, Verbal Permission Granted              Emotional Assessment Appearance:: Appears stated age Attitude/Demeanor/Rapport: Engaged Affect (typically observed): Stable, Hopeful, Happy, Accepting, Appropriate, Pleasant Orientation: : Oriented to Self, Oriented to Place, Oriented to  Time, Oriented to Situation Alcohol / Substance Use: Not Applicable Psych Involvement: No (comment)  Admission diagnosis:  PRIMARY OSTEOARTHRITIS OF LEFT KNEE Patient Active Problem List   Diagnosis Date Noted  . Total knee replacement status 10/13/2018  . Benign essential hypertension 05/17/2018  . Pure hypercholesterolemia 05/17/2018  . Status post total right knee replacement 05/17/2018  . Primary osteoarthritis of both knees 02/21/2018  .  Primary osteoarthritis of left knee 02/21/2018  . Goiter 09/09/2013  . Thyroid nodule 09/09/2013  . GERD (gastroesophageal reflux disease) 07/28/2013  . Sleep apnea, obstructive 07/28/2013   PCP:  Sofie Hartigan, MD [facility]   [id number] [place] [place] [address]Phone: [phone]: [phone]    Social Determinants of Health (SDOH) Interventions    Readmission Risk Interventions No flowsheet data found.

## 2018-10-14 NOTE — Progress Notes (Signed)
Cozzar has been unavailable this shift. Pharmacy just sent med. Currently patient b/p is 113/76. Will not give med at this time with this b/p. Pt agreeable with not receiving med at this time.

## 2018-10-14 NOTE — Anesthesia Postprocedure Evaluation (Signed)
Anesthesia Post Note  Patient: [name]  Procedure(s) Performed: COMPUTER ASSISTED LEFT TOTAL KNEE ARTHROPLASTY (Left Knee)  Patient location during evaluation: Nursing Unit Anesthesia Type: Spinal Level of consciousness: awake and alert and oriented Pain management: pain level controlled Vital Signs Assessment: post-procedure vital signs reviewed and stable Respiratory status: spontaneous breathing, nonlabored ventilation and respiratory function stable Cardiovascular status: stable Postop Assessment: no headache, no backache, patient able to bend at knees, no apparent nausea or vomiting, able to ambulate and adequate PO intake Anesthetic complications: no     Last Vitals:  Vitals:   10/14/18 0446 10/14/18 0729  BP: 117/68 112/70  Pulse: 89 61  Resp: 17 17  Temp: 36.4 C 36.6 C  SpO2: 96% 97%    Last Pain:  Vitals:   10/14/18 0729  TempSrc: Oral  PainSc:                  Lanora Manis

## 2018-10-14 NOTE — Progress Notes (Signed)
Patient has had uneventful day. Tolerated sessions with physical therapy well. No c/o pain. Hemovac remains in place to left knee, polar pak in use.  No distress.

## 2018-10-14 NOTE — Evaluation (Signed)
Occupational Therapy Evaluation Patient Details Name: Frank Stephens MRN: [id number] DOB: 07-06-1950 Today's Date: 10/14/2018    History of Present Illness "Frank Stephens" Frank Stephens is a 68yo Male who [facility] to address chronic Left knee DJD. Pt underwent Rt TKA in 5MA for same. Pt is a recently retired Paramedicpostal worker.   Clinical Impression   Mr. Frank Stephens was seen for OT evaluation this date, POD#1 from above surgery. Pt was active and independent in all ADLs prior to surgery. He states prior to his surgery he would take regular 25-30 minute walks with his wife each week and enjoys golfing. Pt is eager to return to PLOF with less pain and improved safety and independence. Pt currently requires minimal assist for LB dressing while in seated position due to pain and limited AROM of L knee. Pt instructed in polar care mgt, falls prevention strategies, home/routines modifications, DME/AE for LB bathing and dressing tasks, and compression stocking mgt. Pt return verbalized understanding of education provided and states he remembers much about the recover process from his R knee surgery in February. Pt would benefit from skilled OT services including additional instruction in dressing techniques with or without assistive devices for dressing and bathing skills to support recall and carryover prior to discharge and ultimately to maximize safety, independence, and minimize falls risk and caregiver burden. Do not currently anticipate any OT needs following this hospitalization.     Follow Up Recommendations  Follow surgeon's recommendation for DC plan and follow-up therapies    Equipment Recommendations  None recommended by OT    Recommendations for Other Services       Precautions / Restrictions Precautions Precautions: Knee;Fall Precaution Booklet Issued: No Precaution Comments: will issue booklet in afternoon Restrictions Weight Bearing Restrictions: Yes LLE Weight  Bearing: Weight bearing as tolerated      Mobility Bed Mobility Overal bed mobility: Modified Independent                Transfers Overall transfer level: Needs assistance Equipment used: Rolling walker (2 wheeled) Transfers: Sit to/from Stand Sit to Stand: Supervision         General transfer comment: VCs for hand placement and sequencing during STS    Balance Overall balance assessment: Modified Independent                                         ADL either performed or assessed with clinical judgement   ADL Overall ADL's : Needs assistance/impaired Eating/Feeding: Sitting;Independent   Grooming: Sitting;Set up;Standing   Upper Body Bathing: Sitting;Set up;Min guard   Lower Body Bathing: Minimal assistance;Sit to/from stand   Upper Body Dressing : Sitting;Independent   Lower Body Dressing: Sit to/from stand;Minimal assistance Lower Body Dressing Details (indicate cue type and reason): Mod to max assist to don compression stocking. Toilet Transfer: RW;BSC;Ambulation;Supervision/safety;Set up   Toileting- Clothing Manipulation and Hygiene: Sit to/from stand;Supervision/safety       Functional mobility during ADLs: Rolling walker;Supervision/safety       Vision Baseline Vision/History: Wears glasses Wears Glasses: At all times Patient Visual Report: No change from baseline       Perception     Praxis      Pertinent Vitals/Pain Pain Assessment: No/denies pain Pain Score: 1  Pain Location: Left Lateral Knee Pain Descriptors / Indicators: Aching Pain Intervention(s): Limited activity within patient's tolerance;Monitored during session;Premedicated before session  Hand Dominance Right   Extremity/Trunk Assessment Upper Extremity Assessment Upper Extremity Assessment: Overall WFL for tasks assessed   Lower Extremity Assessment Lower Extremity Assessment: LLE deficits/detail;Defer to PT evaluation LLE Deficits / Details:  s/p L TKA LLE Coordination: decreased gross motor;decreased fine motor       Communication Communication Communication: No difficulties   Cognition Arousal/Alertness: Awake/alert Behavior During Therapy: WFL for tasks assessed/performed;Flat affect Overall Cognitive Status: Within Functional Limits for tasks assessed                                     General Comments  hemovac, polar care, knee bandages in place start/end of session.    Exercises  Other Exercises: Pt instructed in falls prevention strategies, polar care mgt, safe use of AE for mobility & LB dressing and bathing tasks. Pt familiar with OT education from prior sx in February. Return verbalized understanding all education provided. Handout provided.   Shoulder Instructions      Home Living Family/patient expects to be discharged to:: Private residence Living Arrangements: Spouse/significant other Available Help at Discharge: Family;Available 24 hours/day Type of Home: House Home Access: Stairs to enter [employer] of Steps: 5 Entrance Stairs-Rails: Right;Left Home Layout: One level     Bathroom Shower/Tub: Occupational psychologist: Handicapped height     Home Equipment: Bedside commode;Shower seat;Adaptive equipment;Walker - 2 wheels;Cane - single point 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        OT Problem List: Decreased coordination;Decreased strength;Decreased safety awareness;Decreased knowledge of use of DME or AE;Decreased range of motion;Decreased activity tolerance      OT Treatment/Interventions: Self-care/ADL training;Balance training;Therapeutic exercise;Therapeutic activities;DME and/or AE instruction;Patient/family education     OT Goals(Current goals can be found in the care plan section) Acute Rehab OT Goals Patient Stated Goal: return to home with outcome as good as other knee OT Goal Formulation: With patient Time For Goal Achievement: 10/28/18 Potential to Achieve Goals: Good ADL Goals Pt Will Perform Lower Body Dressing: with min assist;sit to/from stand(With LRAD PRN for improved safety and functional independence.) Pt Will Transfer to Toilet: ambulating;regular height toilet;with modified independence(With LRAD PRN for improved safety and functional independence.)  OT Frequency: Min 1X/week   Barriers to D/C:            Co-evaluation              AM-PAC OT "6 Clicks" Daily Activity     Outcome Measure Help from another person eating meals?: None Help from another person taking care of personal grooming?: None Help from another person toileting, which includes using toliet, bedpan, or urinal?: A Little Help from another person bathing (including washing, rinsing, drying)?: A Little Help from another person to put on and taking off regular upper body clothing?: None Help from another person to put on and taking off regular lower body clothing?: A Little 6 Click Score: 21   End of Session Equipment Utilized During Treatment: Gait belt;Rolling walker  Activity Tolerance: Patient tolerated treatment well Patient left: in chair;with chair alarm set;with call bell/phone within reach(With polar care applied)  OT Visit Diagnosis: Other abnormalities of gait  and mobility (R26.89)                Time: 1100-1120 OT Time Calculation (min): 20 min Charges:  OT General Charges $OT Visit: 1 Visit OT Evaluation $OT Eval Low Complexity: 1 Low OT Treatments $Self Care/Home Management : 8-22 mins  Rockney GheeSerenity Kalyb Pemble, M.S., OTR/L Ascom: [phone] 10/14/18, 11:42 AM

## 2018-10-14 NOTE — Evaluation (Signed)
Physical Therapy Evaluation Patient Details Name: Frank Stephens MRN: [id number] DOB: December 15, 1950 Today's Date: 10/14/2018   History of Present Illness  "Frank Stephens is a 68yo Male who comes to [facility] for Left TKA to address chronic Left knee DJD. Pt underwent Rt TKA in 5MA for same. Pt is a recently retired Tour manager.  Clinical Impression  Pt admitted with above diagnosis. Pt currently with functional limitations due to the deficits listed below (see "PT Problem List"). Upon entry, pt in bed, awake and agreeable to participate. minGuardAssist to supervision assistance for bed mobility, transfers, and AMB. AMB distance is limited by author to just under 251ft as not to progress activity in excess so early after surgery, however, pt tolerating most of session with very minimal pain to speak of. Functional mobility assessment demonstrates increased effort/time requirements, poor tolerance, but no need for physical assistance, whereas the patient performed these at a higher level of independence PTA. Pt will benefit from skilled PT intervention to increase independence and safety with basic mobility in preparation for discharge to the venue listed below.       Follow Up Recommendations Follow surgeon's recommendation for DC plan and follow-up therapies    Equipment Recommendations  None recommended by PT    Recommendations for Other Services       Precautions / Restrictions Precautions Precautions: Knee Precaution Booklet Issued: No Precaution Comments: will issue booklet in afternoon Restrictions Weight Bearing Restrictions: Yes LLE Weight Bearing: Weight bearing as tolerated      Mobility  Bed Mobility Overal bed mobility: Modified Independent                Transfers Overall transfer level: Needs assistance Equipment used: Rolling walker (2 wheeled) Transfers: Sit to/from Stand Sit to Stand: Supervision         General transfer comment: well established  motor planning, moderate effort required.  Ambulation/Gait Ambulation/Gait assistance: Min guard;Supervision Gait Distance (Feet): 190 Feet(distance limited by Chief Strategy Officer) Assistive device: Rolling walker (2 wheeled) Gait Pattern/deviations: WFL(Within Functional Limits) Gait velocity: 0.50m/s   General Gait Details: naturally progresses to a step-through 2-point gait Rt step limited by 40%.  Stairs            Wheelchair Mobility    Modified Rankin (Stroke Patients Only)       Balance                                             Pertinent Vitals/Pain Pain Assessment: 0-10 Pain Score: 1  Pain Location: Left Lateral Knee Pain Descriptors / Indicators: Aching Pain Intervention(s): Limited activity within patient's tolerance;Monitored during session;Premedicated before session    Home Living Family/patient expects to be discharged to:: Private residence Living Arrangements: Spouse/significant other Available Help at Discharge: Family;Available 24 hours/day Type of Home: House Home Access: Stairs to enter Entrance Stairs-Rails: Psychiatric nurse of Steps: 5 Home Layout: One level Home Equipment: Bedside commode;Shower seat;Adaptive equipment;Walker - 2 wheels;Cane - single point      Prior Function Level of Independence: Independent         Comments: 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     Hand Dominance   Dominant Hand: Right    Extremity/Trunk Assessment  Communication   Communication: No difficulties  Cognition Arousal/Alertness: Awake/alert Behavior During Therapy: WFL for tasks assessed/performed Overall Cognitive Status: Within Functional Limits for tasks assessed                                        General Comments      Exercises Total Joint Exercises Ankle  Circles/Pumps: AROM;20 reps;Both;Supine Quad Sets: Left;AROM;10 reps;Supine Gluteal Sets: AROM;Left;10 reps;Supine Heel Slides: AAROM;Left;Supine;10 reps Hip ABduction/ADduction: AROM;Left;Supine;15 reps Straight Leg Raises: AROM;Left;5 reps Goniometric ROM: Left Knee P/ROM 8-95 degrees   Assessment/Plan    PT Assessment Patient needs continued PT services  PT Problem List Decreased strength;Decreased range of motion;Decreased activity tolerance;Decreased mobility       PT Treatment Interventions DME instruction;Gait training;Stair training;Functional mobility training;Therapeutic activities;Therapeutic exercise;Patient/family education    PT Goals (Current goals can be found in the Care Plan section)  Acute Rehab PT Goals Patient Stated Goal: return to home with outcoem as good as other knee PT Goal Formulation: With patient Time For Goal Achievement: 10/28/18 Potential to Achieve Goals: Good    Frequency BID   Barriers to discharge        Co-evaluation               AM-PAC PT "6 Clicks" Mobility  Outcome Measure Help needed turning from your back to your side while in a flat bed without using bedrails?: None Help needed moving from lying on your back to sitting on the side of a flat bed without using bedrails?: None Help needed moving to and from a bed to a chair (including a wheelchair)?: A Little Help needed standing up from a chair using your arms (e.g., wheelchair or bedside chair)?: A Little Help needed to walk in hospital room?: A Little Help needed climbing 3-5 steps with a railing? : A Little 6 Click Score: 20    End of Session Equipment Utilized During Treatment: Gait belt Activity Tolerance: Patient tolerated treatment well Patient left: in chair;with chair alarm set;with call bell/phone within reach Nurse Communication: Mobility status PT Visit Diagnosis: Difficulty in walking, not elsewhere classified (R26.2);Muscle weakness (generalized)  (M62.81);Other abnormalities of gait and mobility (R26.89)    Time: [id number] PT Time Calculation (min) (ACUTE ONLY): 33 min   Charges:   PT Evaluation $PT Eval Low Complexity: 1 Low PT Treatments $Therapeutic Exercise: 8-22 mins       9:51 AM, 10/14/18 Rosamaria LintsAllan C Skarlette Lattner, PT, DPT Physical Therapist - [facility]  [phone] (ASCOM)   Javarion Douty C 10/14/2018, 9:49 AM

## 2018-10-15 MED ORDER — ENOXAPARIN SODIUM 40 MG/0.4ML ~~LOC~~ SOLN: 40.0000 mg | SUBCUTANEOUS | 0 refills | Status: AC

## 2018-10-15 MED ORDER — CELECOXIB 200 MG PO CAPS: 200.0000 mg | ORAL_CAPSULE | Freq: Two times a day (BID) | ORAL | 1 refills | Status: AC

## 2018-10-15 NOTE — Discharge Summary (Signed)
Physician Discharge Summary  Subjective: 2 Days Post-Op Procedure(s) (LRB): COMPUTER ASSISTED LEFT TOTAL KNEE ARTHROPLASTY (Left) Patient reports pain as mild.   Patient seen in rounds with Dr. Ernest PineHooten. Patient is well, and has had no acute complaints or problems Patient is ready to go home with home health physical therapy  Physician Discharge Summary  Patient ID: Frank PeoplesCharles [name] Stephens MRN: [id number] DOB/AGE: 08/25/1950 68 y.o.  Admit date: 10/13/2018 Discharge date: 10/15/2018  Admission Diagnoses:  Discharge Diagnoses:  Active Problems:   Total knee replacement status   Discharged Condition: good  Hospital Course: The patient is postop day 2 from a left total knee replacement.  He is done very well since surgery.  His vitals have remained stable.  The patient has not had a bowel movement yet.  The patient is ready to go home with home health physical therapy.  He has ambulated 200 feet with physical therapy.  He still needs to do stairs before discharge.  Treatments: surgery:    Left total knee arthroplasty using computer-assisted navigation  SURGEON:  Jena GaussJames P Hooten, Jr. M.D.  ASSISTANT: Unknown FoleyMary Beth Sillman, RN (present and scrubbed throughout the case, critical for assistance with exposure, retraction, instrumentation, and closure)  ANESTHESIA: spinal  ESTIMATED BLOOD LOSS: 50 mL  FLUIDS REPLACED: 1200 mL of crystalloid  TOURNIQUET TIME: 103 minutes  DRAINS: 2 medium Hemovac drains  SOFT TISSUE RELEASES: Anterior cruciate ligament, posterior cruciate ligament, deep medial collateral ligament, patellofemoral ligament  IMPLANTS UTILIZED: DePuy Attune size 7 posterior stabilized femoral component (cemented), size 7 rotating platform tibial component (cemented), 38 mm medialized dome patella (cemented), and a 6 mm stabilized rotating platform polyethylene insert.  Discharge Exam: Blood pressure 120/79, pulse 83, temperature 98.3 F (36.8 C), temperature source  Axillary, resp. rate 19, height 5\' 10"  (1.778 m), weight 94.2 kg, SpO2 95 %.   Disposition: Discharge disposition: 01-Home or Self Care        Allergies as of 10/15/2018      Reactions   Ace Inhibitors Cough   Other Other (See Comments)   Latex Band-Aid causes irritation to the skin& localized swelling.NEGATIVE LATEX IGE ON 10-08-18      Medication List    TAKE these medications   acetaminophen 500 MG tablet Commonly known as: TYLENOL Take 1,000 mg by mouth every 6 (six) hours as needed (for pain.).   aspirin EC 81 MG tablet Take 81 mg by mouth every evening.   celecoxib 200 MG capsule Commonly known as: CELEBREX Take 1 capsule (200 mg total) by mouth 2 (two) times daily.   enoxaparin 40 MG/0.4ML injection Commonly known as: LOVENOX Inject 0.4 mLs (40 mg total) into the skin daily for 14 days.   fluticasone 50 MCG/ACT nasal spray Commonly known as: FLONASE Place 2 sprays into both nostrils daily as needed for allergies or rhinitis.   hydrochlorothiazide 25 MG tablet Commonly known as: HYDRODIURIL Take 25 mg by mouth every morning.   losartan 50 MG tablet Commonly known as: COZAAR Take 50 mg by mouth every morning.   multivitamin with minerals Tabs tablet Take 1 tablet by mouth daily.   pantoprazole 40 MG tablet Commonly known as: PROTONIX Take 40 mg by mouth every morning.            Durable Medical Equipment  (From admission, onward)         Start     Ordered   10/13/18 1705  DME Walker rolling  Once    Question:  Patient needs  a walker to treat with the following condition  Answer:  Total knee replacement status   10/13/18 1704   10/13/18 1705  DME Bedside commode  Once    Question:  Patient needs a bedside commode to treat with the following condition  Answer:  Total knee replacement status   10/13/18 1704         Follow-up Information    Watt Climes, PA On 10/26/2018.   Specialty: Physician Assistant Why: at 9:45am Contact  information: [place] [address] [phone]        Dereck Leep, MD On 11/25/2018.   Specialty: Orthopedic Surgery Why: at 9:15am Contact information: [place] [address] [phone]           Signed: Prescott Parma, Garrett Bowring 10/15/2018, 7:00 AM   Objective: Vital signs in last 24 hours: Temp:  [97.9 F (36.6 C)-98.3 F (36.8 C)] 98.3 F (36.8 C) (07/23 2303) Pulse Rate:  [61-91] 83 (07/23 2303) Resp:  [17-19] 19 (07/23 2303) BP: (112-120)/(70-79) 120/79 (07/23 2303) SpO2:  [93 %-97 %] 95 % (07/23 2303)  Intake/Output from previous day:  Intake/Output Summary (Last 24 hours) at 10/15/2018 0700 Last data filed at 10/15/2018 0416 Gross per 24 hour  Intake 420 ml  Output 1310 ml  Net -890 ml    Intake/Output this shift: Total I/O In: -  Out: 250 [Urine:100; Drains:150]  Labs: No results for input(s): HGB in the last 72 hours. No results for input(s): WBC, RBC, HCT, PLT in the last 72 hours. No results for input(s): NA, K, CL, CO2, BUN, CREATININE, GLUCOSE, CALCIUM in the last 72 hours. No results for input(s): LABPT, INR in the last 72 hours.  EXAM: General - Patient is Alert and Oriented Extremity - Neurovascular intact Sensation intact distally Compartment soft Incision - clean, dry, with a Hemovac removed.  The Hemovac tubing appeared to be intact on removal. Motor Function -plantarflexion and dorsiflexion are intact.  Able to straight leg raise independently.  Assessment/Plan: 2 Days Post-Op Procedure(s) (LRB): COMPUTER ASSISTED LEFT TOTAL KNEE ARTHROPLASTY (Left) Procedure(s) (LRB): COMPUTER ASSISTED LEFT TOTAL KNEE ARTHROPLASTY (Left) Past Medical History:  Diagnosis Date  . Acid reflux   . Arthritis   . Dysrhythmia   . Hypertension   . PONV (postoperative nausea and vomiting)    pt had no n/v with his knee replacement in feb 2020  . Sleep apnea 2013   cpap  . Thyroid nodule     Active Problems:   Total knee replacement status  Estimated body mass index is 29.8 kg/m as calculated from the following:   Height as of this encounter: 5\' 10"  (1.778 m).   Weight as of this encounter: 94.2 kg. Advance diet Up with therapy Discharge home with home health Diet - Regular diet Follow up - in 2 weeks Activity - WBAT Disposition - Home Condition Upon Discharge - Stable DVT Prophylaxis - Lovenox and TED hose  Reche Dixon, PA-C Orthopaedic Surgery 10/15/2018, 7:00 AM

## 2018-10-15 NOTE — TOC Benefit Eligibility Note (Signed)
Transition of Care [facility]) Benefit Eligibility Note    Patient Details  Name: Frank Stephens MRN: [id number] Date of Birth: [birth year]   Medication/Dose: Enoxaparin 40mg  once daily for 14 days  Covered?: Yes  Prescription Coverage Preferred Pharmacy: [facility]  Spoke with Person/Company/Phone Number:: Juliann Pulse with [employer] at [phone]  Co-Pay: $32.08 estimated copay  Prior Approval: No  Deductible: (No deductible on plan.)   Dannette Barbara Phone Number: [phone] or [phone] 10/15/2018, 9:27 AM

## 2018-10-15 NOTE — Progress Notes (Signed)
Physical Therapy Treatment Patient Details Name: Frank Stephens MRN: [id number] DOB: [birth year] Today's Date: 10/15/2018    History of Present Illness "Frank Stephens is a 68yo Male who comes to [facility] for Left TKA to address chronic Left knee DJD. Pt underwent Rt TKA in 5MA for same. Pt is a recently retired Tour manager.    PT Comments    Pt up in chair upon arrival. Reports no issues with independent performance of HEP last night. Reviewed seated Knee extension and flexion A/ROM, isometric strengthening, pt performs well without pain limitations. AMB distance with 100+% increase from yesterday, now >355ft and gait speed improved as well. Pt performed stairs with relative ease. All questions answered. Pt ready for DC from a therapy perspective.     Follow Up Recommendations  Follow surgeon's recommendation for DC plan and follow-up therapies     Equipment Recommendations  None recommended by PT    Recommendations for Other Services       Precautions / Restrictions Precautions Precautions: Knee;Fall Precaution Booklet Issued: Yes (comment) Restrictions Weight Bearing Restrictions: Yes LLE Weight Bearing: Weight bearing as tolerated    Mobility  Bed Mobility               General bed mobility comments: received in and returned to chair  Transfers Overall transfer level: Modified independent Equipment used: Rolling walker (2 wheeled) Transfers: Sit to/from Stand              Ambulation/Gait Ambulation/Gait assistance: Supervision Gait Distance (Feet): 320 Feet Assistive device: Rolling walker (2 wheeled) Gait Pattern/deviations: WFL(Within Functional Limits) Gait velocity: 0.45m/s today; 0.59m/s 1DA   General Gait Details: naturally progresses to a step-through 2-point gait Rt step limited by >50%, more pronounced than yesterday.   Stairs Stairs: Yes Stairs assistance: Supervision Stair Management: Two rails Number of Stairs: 4 General stair  comments: VC for instruction, minimal effort required.   Wheelchair Mobility    Modified Rankin (Stroke Patients Only)       Balance Overall balance assessment: Modified Independent                                          Cognition Arousal/Alertness: Awake/alert Behavior During Therapy: WFL for tasks assessed/performed;Flat affect Overall Cognitive Status: Within Functional Limits for tasks assessed                                        Exercises Total Joint Exercises Long Arc Quad: Left;AROM;Seated;15 reps Knee Flexion: Left;Seated;15 reps;AROM Goniometric ROM: Lt knee A/ROM: 13-99 degrees    General Comments        Pertinent Vitals/Pain Pain Assessment: No/denies pain Pain Intervention(s): Monitored during session;Premedicated before session;Repositioned;Ice applied    Home Living                      Prior Function            PT Goals (current goals can now be found in the care plan section) Acute Rehab PT Goals Patient Stated Goal: return to home with outcome as good as other knee PT Goal Formulation: With patient Time For Goal Achievement: 10/28/18 Potential to Achieve Goals: Good Progress towards PT goals: Progressing toward goals    Frequency    BID  PT Plan Current plan remains appropriate    Co-evaluation              AM-PAC PT "6 Clicks" Mobility   Outcome Measure  Help needed turning from your back to your side while in a flat bed without using bedrails?: None Help needed moving from lying on your back to sitting on the side of a flat bed without using bedrails?: None Help needed moving to and from a bed to a chair (including a wheelchair)?: None Help needed standing up from a chair using your arms (e.g., wheelchair or bedside chair)?: None Help needed to walk in hospital room?: None Help needed climbing 3-5 steps with a railing? : None 6 Click Score: 24    End of Session  Equipment Utilized During Treatment: Gait belt Activity Tolerance: Patient tolerated treatment well;No increased pain Patient left: with call bell/phone within reach;in bed;with bed alarm set Nurse Communication: Mobility status PT Visit Diagnosis: Difficulty in walking, not elsewhere classified (R26.2);Muscle weakness (generalized) (M62.81);Other abnormalities of gait and mobility (R26.89)     Time: [id number] PT Time Calculation (min) (ACUTE ONLY): 23 min  Charges:  $Gait Training: 8-22 mins $Therapeutic Exercise: 8-22 mins                     9:42 AM, 10/15/18 Rosamaria LintsAllan C Buccola, PT, DPT Physical Therapist - [facility]  [phone] (ASCOM)    Buccola,Allan C 10/15/2018, 9:40 AM

## 2018-10-15 NOTE — Progress Notes (Signed)
  Subjective: 2 Days Post-Op Procedure(s) (LRB): COMPUTER ASSISTED LEFT TOTAL KNEE ARTHROPLASTY (Left) Patient reports pain as mild.   Patient seen in rounds with Dr. Marry Guan. Patient is well, and has had no acute complaints or problems Plan is to go Home after hospital stay. Negative for chest pain and shortness of breath Fever: no Gastrointestinal: Negative for nausea and vomiting  Objective: Vital signs in last 24 hours: Temp:  [97.9 F (36.6 C)-98.3 F (36.8 C)] 98.3 F (36.8 C) (07/23 2303) Pulse Rate:  [61-91] 83 (07/23 2303) Resp:  [17-19] 19 (07/23 2303) BP: (112-120)/(70-79) 120/79 (07/23 2303) SpO2:  [93 %-97 %] 95 % (07/23 2303)  Intake/Output from previous day:  Intake/Output Summary (Last 24 hours) at 10/15/2018 0655 Last data filed at 10/15/2018 0416 Gross per 24 hour  Intake 420 ml  Output 1310 ml  Net -890 ml    Intake/Output this shift: Total I/O In: -  Out: 250 [Urine:100; Drains:150]  Labs: No results for input(s): HGB in the last 72 hours. No results for input(s): WBC, RBC, HCT, PLT in the last 72 hours. No results for input(s): NA, K, CL, CO2, BUN, CREATININE, GLUCOSE, CALCIUM in the last 72 hours. No results for input(s): LABPT, INR in the last 72 hours.   EXAM General - Patient is Alert and Oriented Extremity - Neurovascular intact Sensation intact distally Compartment soft Dressing/Incision - clean, dry, with Hemovac removed with no complication.  The Hemovac tubing was intact on removal. Motor Function - intact, moving foot and toes well on exam.  Able to straight leg raise independently  Past Medical History:  Diagnosis Date  . Acid reflux   . Arthritis   . Dysrhythmia   . Hypertension   . PONV (postoperative nausea and vomiting)    pt had no n/v with his knee replacement in feb 2020  . Sleep apnea 2013   cpap  . Thyroid nodule     Assessment/Plan: 2 Days Post-Op Procedure(s) (LRB): COMPUTER ASSISTED LEFT TOTAL KNEE ARTHROPLASTY  (Left) Active Problems:   Total knee replacement status  Estimated body mass index is 29.8 kg/m as calculated from the following:   Height as of this encounter: 5\' 10"  (1.778 m).   Weight as of this encounter: 94.2 kg. Advance diet Up with therapy D/C IV fluids Discharge home with home health planned for today.   The patient needs to have a bowel movement before discharge.  DVT Prophylaxis - Lovenox, Foot Pumps and TED hose Weight-Bearing as tolerated to left leg  Reche Dixon, PA-C Orthopaedic Surgery 10/15/2018, 6:55 AM

## 2018-10-15 NOTE — Progress Notes (Signed)
Discharge summary reviewed with verbal understanding. Changed dressing to left knee and thigh TED. Rxs, CPAP, and belongings given upon discharge.

## 2018-10-15 NOTE — TOC Transition Note (Signed)
Transition of Care [facility]) - CM/SW Discharge Note   Patient Details  Name: Frank Stephens MRN: [id number] Date of Birth: 1950/11/20  [facility] CM/SW Contact:  Su Hilt, RN Phone Number: 10/15/2018, 9:01 AM   Clinical Narrative:    Patient to DC home today, his wife will provide transportation, he has a RW and 3 in 1 at home, set up with [facility], No further needs   Final next level of care: Home w Home Health Services Barriers to Discharge: Continued Medical Work up   Patient Goals and CMS Choice Patient states their goals for this hospitalization and ongoing recovery are:: go home CMS Medicare.gov Compare Post Acute Care list provided to:: Patient Choice offered to / list presented to : Patient  Discharge Placement                       Discharge Plan and Services   Discharge Planning Services: CM Consult Post Acute Care Choice: Home Health          DME Arranged: N/A         HH Arranged: PT HH Agency: [facility] (formerly [employer])   Time [place]: 5093 Representative spoke with at [place]: [place] Interventions     Readmission Risk Interventions No flowsheet data found.

## 2019-05-19 ENCOUNTER — Ambulatory Visit: Payer: 59 | Attending: Internal Medicine

## 2019-05-19 DIAGNOSIS — Z23 Encounter for immunization: Secondary | ICD-10-CM | POA: Insufficient documentation

## 2019-05-19 NOTE — Progress Notes (Signed)
   Covid-19 Vaccination Clinic  Name:  Castulo Scarpelli    MRN: [id number] DOB: 03-Jan-1951  05/19/2019  Mr. Normington was observed post Covid-19 immunization for 15 minutes without incidence. He was provided with Vaccine Information Sheet and instruction to access the V-Safe system.   Mr. Knoble was instructed to call 911 with any severe reactions post vaccine: Marland Kitchen Difficulty breathing  . Swelling of your face and throat  . A fast heartbeat  . A bad rash all over your body  . Dizziness and weakness    Immunizations Administered    Name Date Dose VIS Date Route   Pfizer COVID-19 Vaccine 05/19/2019 10:51 AM 0.3 mL 03/04/2019 Intramuscular   Manufacturer: [employer], [employer]   Lot: [id number]   NDC: [address]-4

## 2019-06-15 ENCOUNTER — Ambulatory Visit: Payer: 59 | Attending: Internal Medicine

## 2019-06-15 DIAGNOSIS — Z23 Encounter for immunization: Secondary | ICD-10-CM

## 2019-06-15 NOTE — Progress Notes (Signed)
   Covid-19 Vaccination Clinic  Name:  Aamir Mclinden    MRN: [id number] DOB: 03-May-1950  06/15/2019  Mr. Enns was observed post Covid-19 immunization for 15 minutes without incident. He was provided with Vaccine Information Sheet and instruction to access the V-Safe system.   Mr. Krol was instructed to call 911 with any severe reactions post vaccine: Marland Kitchen Difficulty breathing  . Swelling of face and throat  . A fast heartbeat  . A bad rash all over body  . Dizziness and weakness   Immunizations Administered    Name Date Dose VIS Date Route   Pfizer COVID-19 Vaccine 06/15/2019  8:42 AM 0.3 mL 03/04/2019 Intramuscular   Manufacturer: [employer], [employer]   Lot: [id number]   NDC: [address]-7

## 2020-08-25 IMAGING — DX DG KNEE 1-2V PORT*R*
2 series · 2 of 2 positions shown · non-contrast
Comparison: None.

CLINICAL DATA: 67-year-old male post total right knee replacement.
Initial encounter.

EXAM:
PORTABLE RIGHT KNEE - 1-2 VIEW

[knee ap]
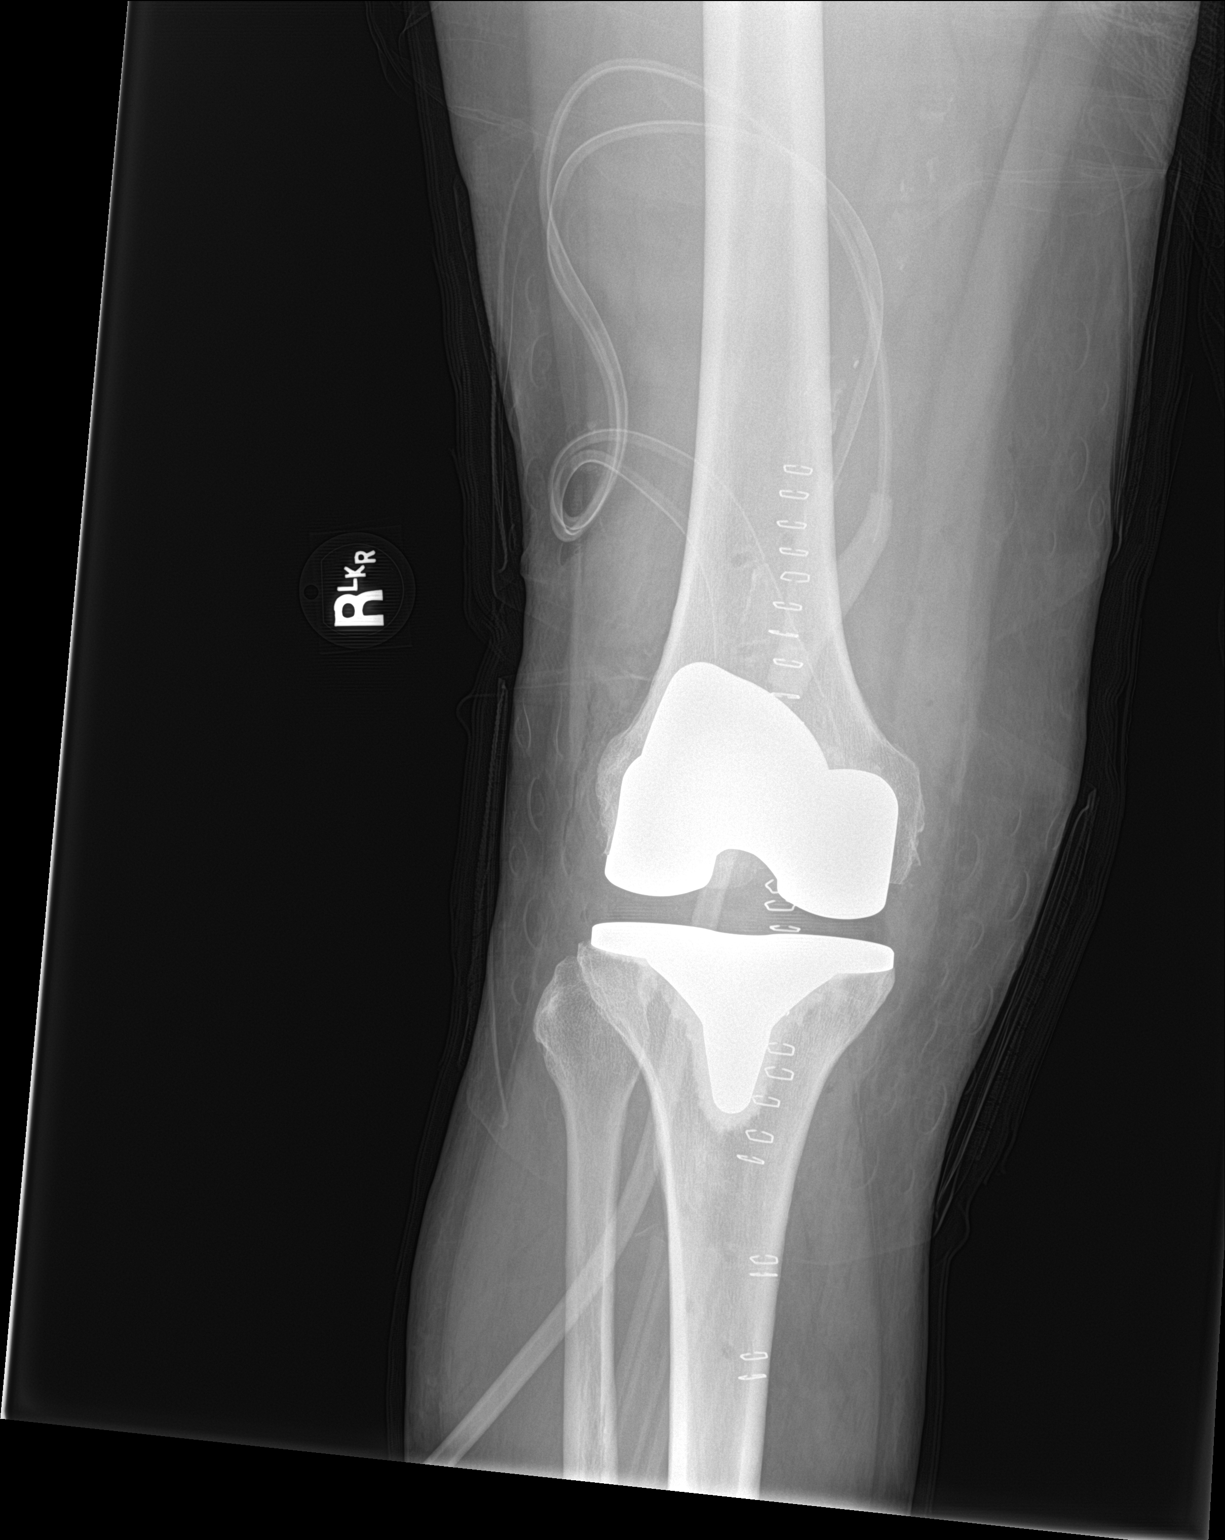

[knee lat]
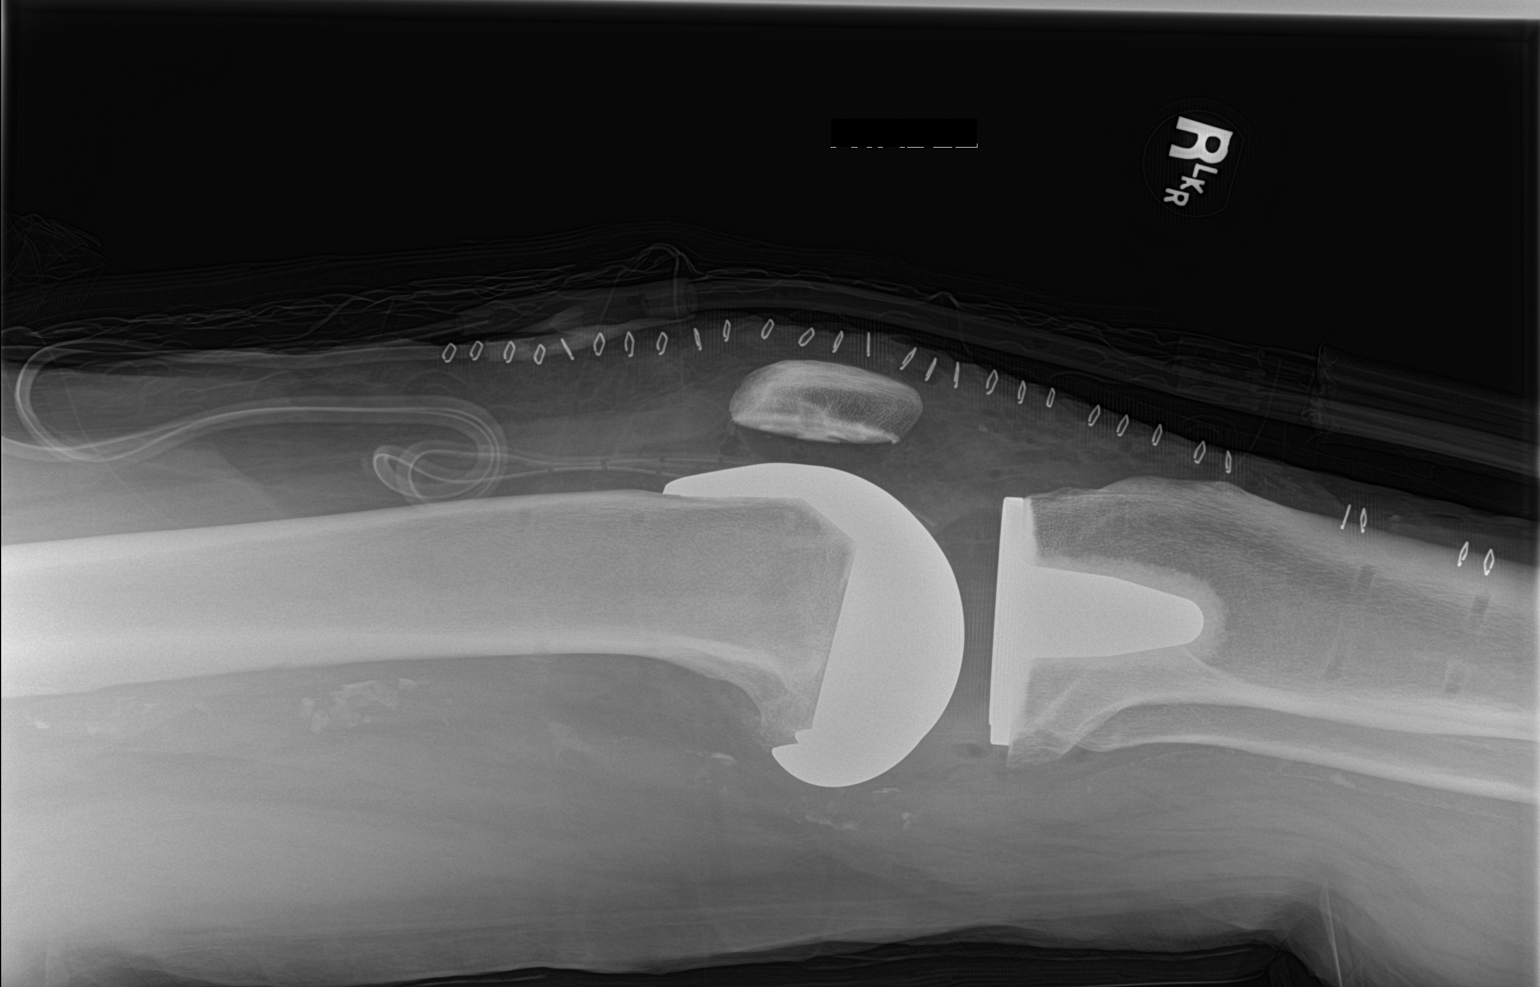

[2 of 2 positions shown; findings below may reference images not displayed]

FINDINGS: Post total right knee replacement which appears in satisfactory
position without complication noted. Pin tracks distal femur and
proximal tibia. Drain is in place. Vascular calcifications.
IMPRESSION: Post total right knee replacement.

## 2021-01-21 IMAGING — DX PORTABLE LEFT KNEE - 1-2 VIEW
2 series · 2 of 2 positions shown · non-contrast
Comparison: None.

CLINICAL DATA: Status post left knee replacement

EXAM:
PORTABLE LEFT KNEE - 1-2 VIEW

[knee ap]
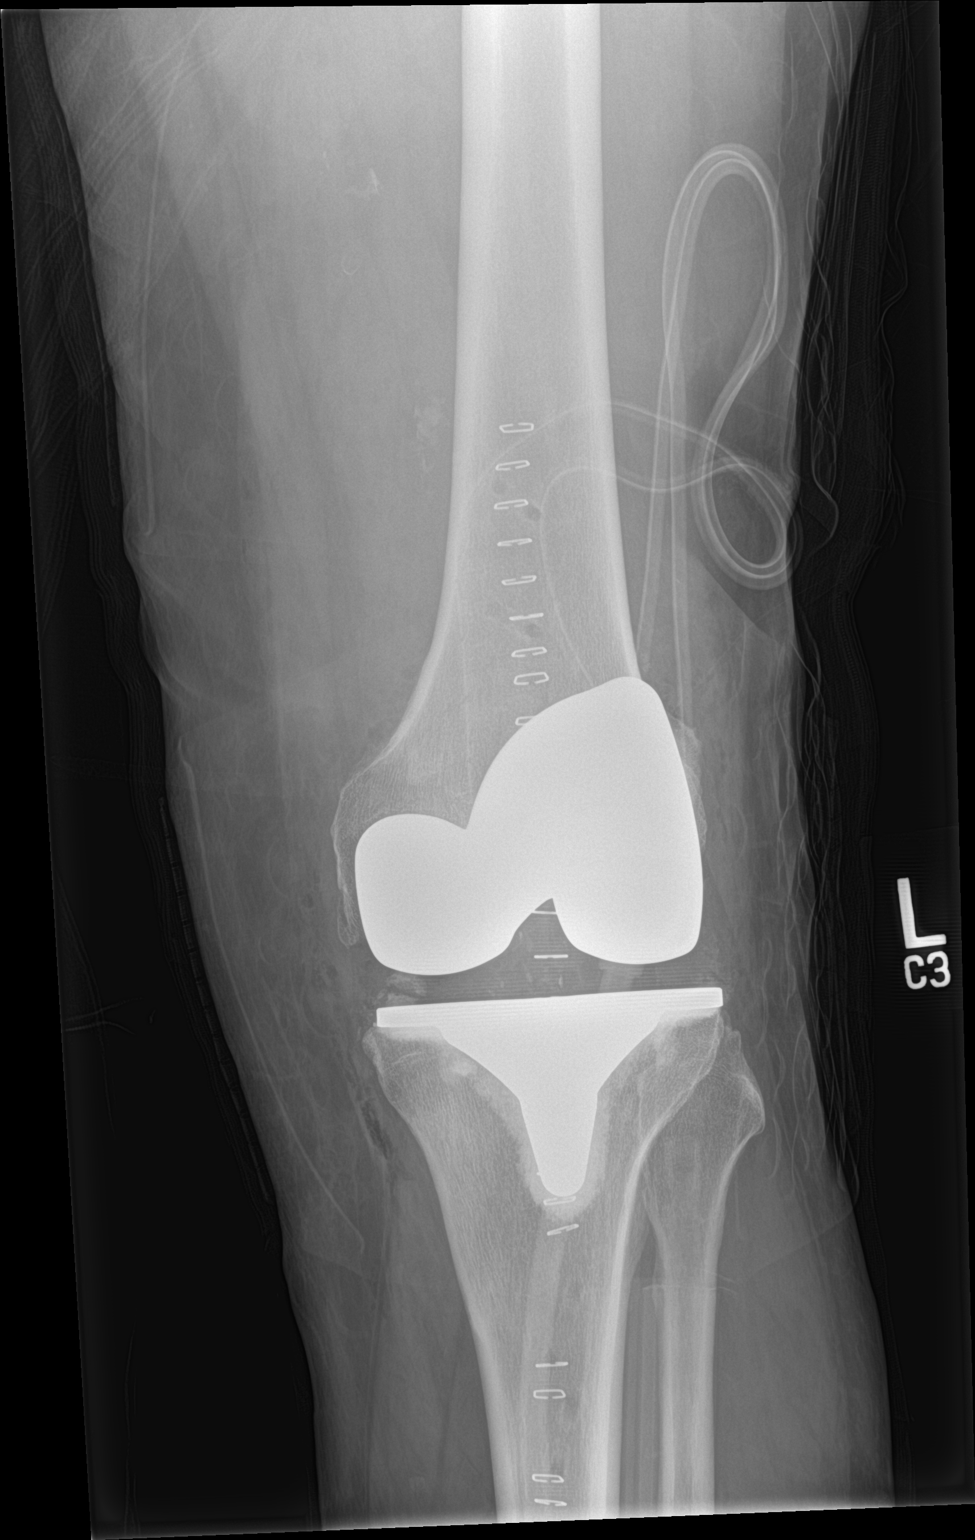

[knee lat]
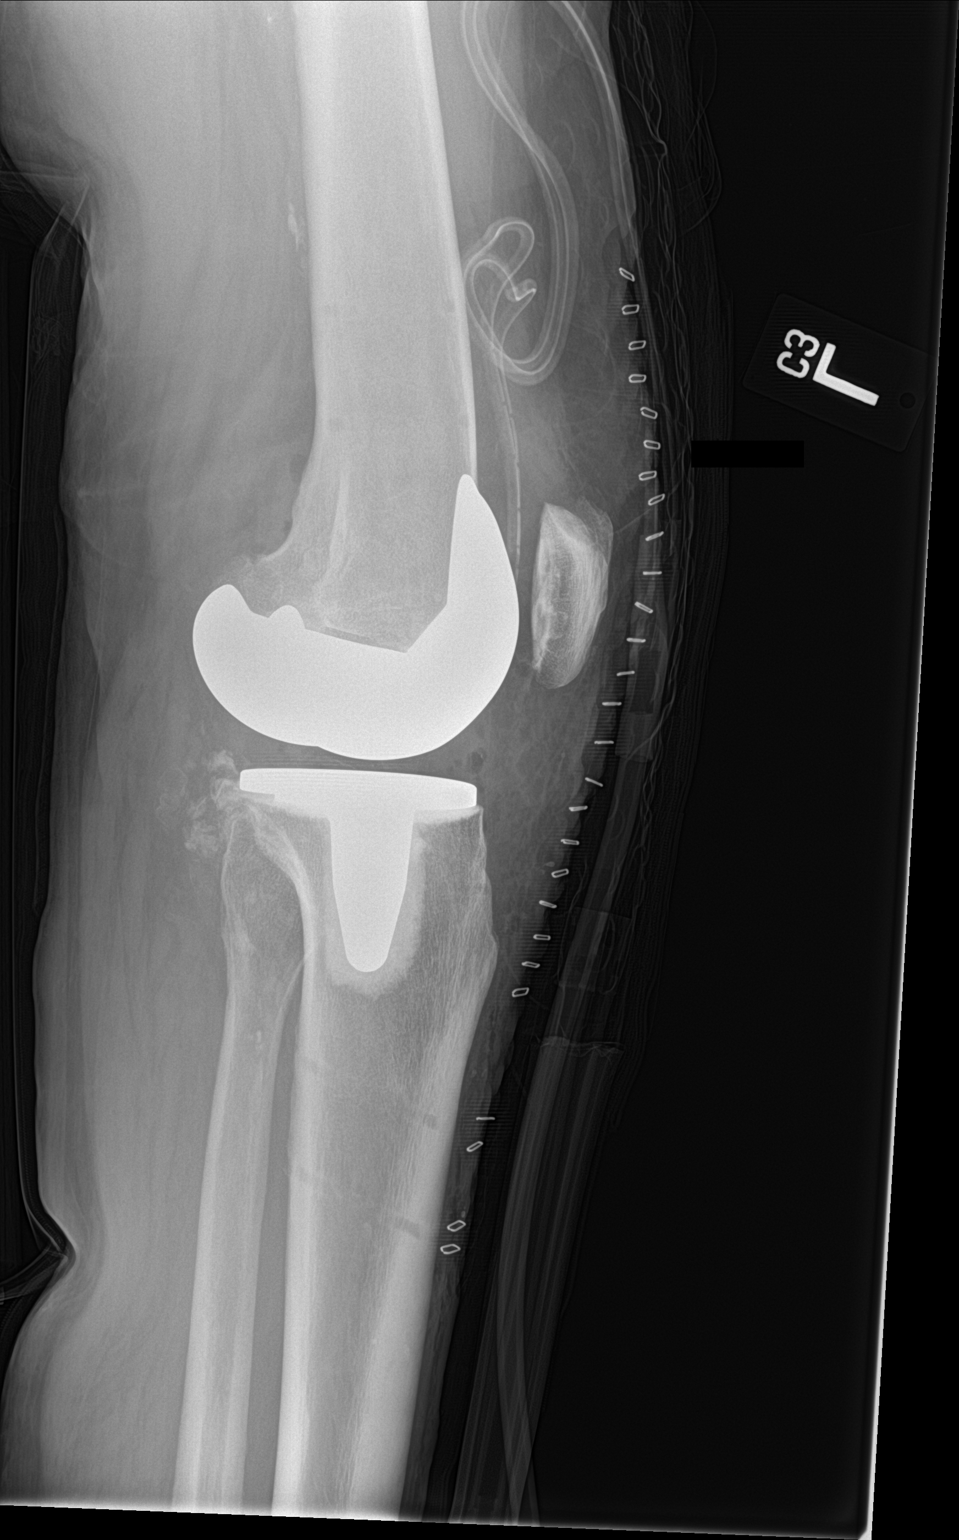

[2 of 2 positions shown; findings below may reference images not displayed]

FINDINGS: Left knee prosthesis is now seen in satisfactory position. Surgical
drains are noted. No acute bony or soft tissue abnormality is seen.
IMPRESSION: Status post left knee replacement.

## 2022-12-01 ENCOUNTER — Ambulatory Visit: Payer: Medicare HMO

## 2022-12-01 DIAGNOSIS — D122 Benign neoplasm of ascending colon: Secondary | ICD-10-CM | POA: Diagnosis not present

## 2022-12-01 DIAGNOSIS — D123 Benign neoplasm of transverse colon: Secondary | ICD-10-CM | POA: Diagnosis present

## 2022-12-01 DIAGNOSIS — K5730 Diverticulosis of large intestine without perforation or abscess without bleeding: Secondary | ICD-10-CM | POA: Diagnosis not present

## 2022-12-01 DIAGNOSIS — K640 First degree hemorrhoids: Secondary | ICD-10-CM | POA: Diagnosis not present
